# Patient Record
Sex: Female | Born: 1935
Health system: Southern US, Community
[De-identification: ages and names within clinical notes are randomized; demographics above are authoritative.]

## PROBLEM LIST (undated history)

## (undated) DIAGNOSIS — E559 Vitamin D deficiency, unspecified: Secondary | ICD-10-CM

## (undated) DIAGNOSIS — I10 Essential (primary) hypertension: Secondary | ICD-10-CM

## (undated) DIAGNOSIS — L630 Alopecia (capitis) totalis: Secondary | ICD-10-CM

## (undated) DIAGNOSIS — E039 Hypothyroidism, unspecified: Secondary | ICD-10-CM

## (undated) DIAGNOSIS — F32A Depression, unspecified: Secondary | ICD-10-CM

## (undated) DIAGNOSIS — R3129 Other microscopic hematuria: Secondary | ICD-10-CM

## (undated) DIAGNOSIS — K5792 Diverticulitis of intestine, part unspecified, without perforation or abscess without bleeding: Secondary | ICD-10-CM

## (undated) DIAGNOSIS — N183 Chronic kidney disease, stage 3 unspecified: Secondary | ICD-10-CM

## (undated) DIAGNOSIS — R51 Headache: Secondary | ICD-10-CM

## (undated) DIAGNOSIS — G8929 Other chronic pain: Secondary | ICD-10-CM

## (undated) DIAGNOSIS — M1990 Unspecified osteoarthritis, unspecified site: Secondary | ICD-10-CM

## (undated) DIAGNOSIS — F329 Major depressive disorder, single episode, unspecified: Secondary | ICD-10-CM

## (undated) DIAGNOSIS — D696 Thrombocytopenia, unspecified: Secondary | ICD-10-CM

## (undated) DIAGNOSIS — R0600 Dyspnea, unspecified: Secondary | ICD-10-CM

## (undated) DIAGNOSIS — M8580 Other specified disorders of bone density and structure, unspecified site: Secondary | ICD-10-CM

## (undated) DIAGNOSIS — E0822 Diabetes mellitus due to underlying condition with diabetic chronic kidney disease: Secondary | ICD-10-CM

## (undated) DIAGNOSIS — K5790 Diverticulosis of intestine, part unspecified, without perforation or abscess without bleeding: Secondary | ICD-10-CM

## (undated) DIAGNOSIS — E785 Hyperlipidemia, unspecified: Secondary | ICD-10-CM

## (undated) DIAGNOSIS — M549 Dorsalgia, unspecified: Secondary | ICD-10-CM

## (undated) HISTORY — DX: Thrombocytopenia, unspecified: D69.6

## (undated) HISTORY — PX: OTHER SURGICAL HISTORY: SHX169

## (undated) HISTORY — DX: Unspecified osteoarthritis, unspecified site: M19.90

## (undated) HISTORY — DX: Other specified disorders of bone density and structure, unspecified site: M85.80

## (undated) HISTORY — DX: Hyperlipidemia, unspecified: E78.5

## (undated) HISTORY — PX: CATARACT EXTRACTION, BILATERAL: SHX1313

## (undated) HISTORY — DX: Diabetes mellitus due to underlying condition with diabetic chronic kidney disease: E08.22

## (undated) HISTORY — DX: Other microscopic hematuria: R31.29

## (undated) HISTORY — DX: Alopecia (capitis) totalis: L63.0

## (undated) HISTORY — DX: Major depressive disorder, single episode, unspecified: F32.9

## (undated) HISTORY — DX: Vitamin D deficiency, unspecified: E55.9

## (undated) HISTORY — DX: Essential (primary) hypertension: I10

## (undated) HISTORY — DX: Depression, unspecified: F32.A

## (undated) HISTORY — DX: Other chronic pain: G89.29

## (undated) HISTORY — DX: Headache: R51

## (undated) HISTORY — DX: Chronic kidney disease, stage 3 (moderate): N18.3

## (undated) HISTORY — DX: Diverticulosis of intestine, part unspecified, without perforation or abscess without bleeding: K57.90

## (undated) HISTORY — DX: Chronic kidney disease, stage 3 unspecified: N18.30

---

## 2002-05-18 ENCOUNTER — Encounter: Payer: Self-pay | Admitting: Emergency Medicine

## 2002-05-18 ENCOUNTER — Emergency Department (HOSPITAL_COMMUNITY): Admission: EM | Admit: 2002-05-18 | Discharge: 2002-05-18 | Payer: Self-pay | Admitting: Emergency Medicine

## 2003-12-18 ENCOUNTER — Emergency Department (HOSPITAL_COMMUNITY): Admission: EM | Admit: 2003-12-18 | Discharge: 2003-12-19 | Payer: Self-pay | Admitting: Emergency Medicine

## 2005-12-31 ENCOUNTER — Inpatient Hospital Stay (HOSPITAL_COMMUNITY): Admission: AD | Admit: 2005-12-31 | Discharge: 2006-01-06 | Payer: Self-pay | Admitting: Endocrinology

## 2006-01-17 ENCOUNTER — Inpatient Hospital Stay (HOSPITAL_COMMUNITY): Admission: AD | Admit: 2006-01-17 | Discharge: 2006-01-21 | Payer: Self-pay | Admitting: Endocrinology

## 2007-11-30 ENCOUNTER — Ambulatory Visit: Payer: Self-pay | Admitting: Hematology & Oncology

## 2007-12-13 LAB — CBC WITH DIFFERENTIAL (CANCER CENTER ONLY)
BASO#: 0 10*3/uL (ref 0.0–0.2)
BASO%: 0.7 % (ref 0.0–2.0)
EOS%: 1.7 % (ref 0.0–7.0)
HCT: 40.4 % (ref 34.8–46.6)
HGB: 13.6 g/dL (ref 11.6–15.9)
LYMPH%: 38.3 % (ref 14.0–48.0)
MCH: 29.6 pg (ref 26.0–34.0)
MCHC: 33.7 g/dL (ref 32.0–36.0)
MCV: 88 fL (ref 81–101)
MONO%: 5.2 % (ref 0.0–13.0)
NEUT%: 54.1 % (ref 39.6–80.0)
RDW: 11.7 % (ref 10.5–14.6)

## 2007-12-13 LAB — CHCC SATELLITE - SMEAR

## 2007-12-18 LAB — VITAMIN B12: Vitamin B-12: 266 pg/mL (ref 211–911)

## 2007-12-18 LAB — COMPREHENSIVE METABOLIC PANEL
ALT: 22 U/L (ref 0–35)
AST: 15 U/L (ref 0–37)
Albumin: 4.1 g/dL (ref 3.5–5.2)
Alkaline Phosphatase: 76 U/L (ref 39–117)
BUN: 20 mg/dL (ref 6–23)
CO2: 25 mEq/L (ref 19–32)
Calcium: 9.7 mg/dL (ref 8.4–10.5)
Chloride: 102 mEq/L (ref 96–112)
Creatinine, Ser: 1.13 mg/dL (ref 0.40–1.20)
Glucose, Bld: 114 mg/dL — ABNORMAL HIGH (ref 70–99)
Potassium: 4.3 mEq/L (ref 3.5–5.3)
Sodium: 137 mEq/L (ref 135–145)
Total Bilirubin: 0.4 mg/dL (ref 0.3–1.2)
Total Protein: 7.7 g/dL (ref 6.0–8.3)

## 2007-12-18 LAB — LUPUS ANTICOAGULANT PANEL
DRVVT: 43.1 secs (ref 36.1–47.0)
PTT Lupus Anticoagulant: 49.6 secs — ABNORMAL HIGH (ref 36.3–48.8)

## 2007-12-18 LAB — PROTEIN ELECTROPHORESIS, SERUM
Alpha-1-Globulin: 4.6 % (ref 2.9–4.9)
Alpha-2-Globulin: 11.8 % (ref 7.1–11.8)
Beta Globulin: 6.8 % (ref 4.7–7.2)
Gamma Globulin: 19.7 % — ABNORMAL HIGH (ref 11.1–18.8)
Total Protein, Serum Electrophoresis: 7.7 g/dL (ref 6.0–8.3)

## 2007-12-18 LAB — APTT: aPTT: 34 seconds (ref 24–37)

## 2007-12-18 LAB — ANA: Anti Nuclear Antibody(ANA): NEGATIVE

## 2008-09-27 ENCOUNTER — Emergency Department (HOSPITAL_COMMUNITY): Admission: EM | Admit: 2008-09-27 | Discharge: 2008-09-27 | Payer: Self-pay | Admitting: Family Medicine

## 2009-08-12 ENCOUNTER — Encounter: Admission: RE | Admit: 2009-08-12 | Discharge: 2009-08-12 | Payer: Self-pay | Admitting: Internal Medicine

## 2009-11-28 ENCOUNTER — Emergency Department (HOSPITAL_COMMUNITY): Admission: EM | Admit: 2009-11-28 | Discharge: 2009-11-28 | Payer: Self-pay | Admitting: Emergency Medicine

## 2010-03-31 LAB — DIFFERENTIAL
Basophils Absolute: 0.1 10*3/uL (ref 0.0–0.1)
Basophils Relative: 1 % (ref 0–1)
Eosinophils Absolute: 0.2 10*3/uL (ref 0.0–0.7)
Eosinophils Relative: 2 % (ref 0–5)
Lymphocytes Relative: 15 % (ref 12–46)
Lymphs Abs: 1.7 10*3/uL (ref 0.7–4.0)
Monocytes Absolute: 0.7 10*3/uL (ref 0.1–1.0)
Monocytes Relative: 6 % (ref 3–12)
Neutro Abs: 8.3 10*3/uL — ABNORMAL HIGH (ref 1.7–7.7)
Neutrophils Relative %: 76 % (ref 43–77)

## 2010-03-31 LAB — COMPREHENSIVE METABOLIC PANEL
ALT: 33 U/L (ref 0–35)
AST: 36 U/L (ref 0–37)
Albumin: 4.3 g/dL (ref 3.5–5.2)
Alkaline Phosphatase: 101 U/L (ref 39–117)
BUN: 12 mg/dL (ref 6–23)
CO2: 22 mEq/L (ref 19–32)
Calcium: 9.7 mg/dL (ref 8.4–10.5)
Chloride: 103 mEq/L (ref 96–112)
Creatinine, Ser: 1.24 mg/dL — ABNORMAL HIGH (ref 0.4–1.2)
GFR calc Af Amer: 50 mL/min — ABNORMAL LOW (ref >60)
GFR calc non Af Amer: 41 mL/min — ABNORMAL LOW (ref >60)
Glucose, Bld: 131 mg/dL — ABNORMAL HIGH (ref 70–99)
Potassium: 4.1 mEq/L (ref 3.5–5.1)
Sodium: 137 mEq/L (ref 135–145)
Total Bilirubin: 0.8 mg/dL (ref 0.3–1.2)
Total Protein: 8.1 g/dL (ref 6.0–8.3)

## 2010-03-31 LAB — CBC
HCT: 43.1 % (ref 36.0–46.0)
Hemoglobin: 14.9 g/dL (ref 12.0–15.0)
MCH: 29.9 pg (ref 26.0–34.0)
MCHC: 34.6 g/dL (ref 30.0–36.0)
MCV: 86.5 fL (ref 78.0–100.0)
Platelets: ADEQUATE 10*3/uL (ref 150–400)
RBC: 4.99 MIL/uL (ref 3.87–5.11)
RDW: 13.8 % (ref 11.5–15.5)
WBC: 11 10*3/uL — ABNORMAL HIGH (ref 4.0–10.5)

## 2010-03-31 LAB — URINALYSIS, ROUTINE W REFLEX MICROSCOPIC
Bilirubin Urine: NEGATIVE
Glucose, UA: NEGATIVE mg/dL
Ketones, ur: NEGATIVE mg/dL
Leukocytes, UA: NEGATIVE
Nitrite: NEGATIVE
Protein, ur: NEGATIVE mg/dL
Specific Gravity, Urine: 1.011 (ref 1.005–1.030)
Urobilinogen, UA: 0.2 mg/dL (ref 0.0–1.0)
pH: 6.5 (ref 5.0–8.0)

## 2010-03-31 LAB — URINE MICROSCOPIC-ADD ON

## 2010-04-24 LAB — CBC
HCT: 41.8 % (ref 36.0–46.0)
Hemoglobin: 13.9 g/dL (ref 12.0–15.0)
MCHC: 33.1 g/dL (ref 30.0–36.0)
MCV: 88.6 fL (ref 78.0–100.0)
Platelets: UNDETERMINED 10*3/uL (ref 150–400)
RBC: 4.72 MIL/uL (ref 3.87–5.11)
RDW: 13.7 % (ref 11.5–15.5)
WBC: 7 10*3/uL (ref 4.0–10.5)

## 2010-04-24 LAB — HEPATIC FUNCTION PANEL
ALT: 28 U/L (ref 0–35)
AST: 26 U/L (ref 0–37)
Albumin: 4.1 g/dL (ref 3.5–5.2)
Alkaline Phosphatase: 95 U/L (ref 39–117)
Bilirubin, Direct: <0.1 mg/dL (ref 0.0–0.3)
Total Bilirubin: 0.3 mg/dL (ref 0.3–1.2)
Total Protein: 7.9 g/dL (ref 6.0–8.3)

## 2010-04-24 LAB — URINE CULTURE
Colony Count: NO GROWTH
Culture: NO GROWTH

## 2010-04-24 LAB — POCT URINALYSIS DIP (DEVICE)
Glucose, UA: 100 mg/dL — AB
Ketones, ur: NEGATIVE mg/dL
Nitrite: POSITIVE — AB
Protein, ur: 30 mg/dL — AB
Specific Gravity, Urine: 1.02 (ref 1.005–1.030)
Urobilinogen, UA: 1 mg/dL (ref 0.0–1.0)
pH: 5.5 (ref 5.0–8.0)

## 2010-04-24 LAB — DIFFERENTIAL
Basophils Absolute: 0 10*3/uL (ref 0.0–0.1)
Basophils Relative: 0 % (ref 0–1)
Eosinophils Absolute: 0.1 10*3/uL (ref 0.0–0.7)
Eosinophils Relative: 2 % (ref 0–5)
Lymphocytes Relative: 21 % (ref 12–46)
Lymphs Abs: 1.5 10*3/uL (ref 0.7–4.0)
Monocytes Absolute: 0.4 10*3/uL (ref 0.1–1.0)
Monocytes Relative: 6 % (ref 3–12)
Neutro Abs: 5 10*3/uL (ref 1.7–7.7)
Neutrophils Relative %: 71 % (ref 43–77)

## 2010-06-05 NOTE — Discharge Summary (Signed)
Kelly Barker, Kelly Barker               ACCOUNT NO.:  [id number]   MEDICAL RECORD NO.:  [id number]          PATIENT TYPE:  INP   LOCATION:  5742                         FACILITY:  [facility]   PHYSICIAN:  Alfonse Alpers. Gegick, M.D.DATE OF BIRTH:  [birth year]   DATE OF ADMISSION:  01/17/2006  DATE OF DISCHARGE:                               DISCHARGE SUMMARY   HISTORY OF PRESENT ILLNESS:  This is a 77 year old woman who presents  with a history of abdominal pain. The patient has previously had an  episode of diverticulitis. She was discharged from the hospital  approximately one week prior to this admission. She was doing better,  feeling better, not having any abdominal pain for several days after her  discharge and then she started having increased nausea, diarrhea, and  abdominal pain.   She also has a history of diabetes and dyslipidemia. In addition she has  had an episode of thrombocytopenia for unclear reasons, and this has  resolved spontaneously.   PHYSICAL EXAMINATION:  GENERAL:  Revealed a well-developed woman who  appeared in no distress. She was markedly uncomfortable.  ABDOMEN:  Soft. Tenderness was present in the left middle quadrant of  the abdomen.   IMPRESSION ON ADMISSION:  Recurrent diverticulitis.   The patient was admitted to the hospital. A CT scan was ordered. She was  started on liquids intravenously. A CT scan of the abdomen was  consistent with a diffuse inflammatory process.  This was consistent  with C. difficile. A C. difficile study was done, and, indeed, it was  positive.  Her white count on admission was 14,000. Her blood pressure  was controlled. She was then switched from IV metronidazole to p.o. and  gradually has improved. She has remained afebrile. She is improved and  now tolerating full liquid diet without difficulty. Her diarrhea has  improved but still present. In view of the fact that she appears to be  stable at this time she will be discharged and  followed closely as an  outpatient.   IMPRESSION ON DISCHARGE:  1. Clostridium difficile pneumonia.  2. History of diabetes mellitus.  3. History of dyslipidemia.   MEDICATIONS ON DISCHARGE:  She will continue taking the medications that  she had been taking prior to her admission. She will be taking Actos 30  mg daily, Tenormin 25 mg daily, Synthroid 0.1 mg daily, Desyrel 100 mg  h.s., Pravachol 20 mg h.s.   DIET ON DISCHARGE:  Will be on a full liquid diet.   ACTIVITY ON DISCHARGE:  As tolerated.   NEW MEDICATIONS:  New medications added will be Flagyl 500 mg one t.i.d.  and also Lomotil q.4 h. p.r.n.   PLANNED FOLLOWUP:  She will be seen in the office in a period of four  days.   CONDITION ON DISCHARGE:  Improved.           ______________________________  Alfonse Alpers Dagoberto Ligas, M.D.     CGG/MEDQ  D:  01/21/2006  T:  01/21/2006  Job:  [id number]

## 2010-06-05 NOTE — Consult Note (Signed)
Kelly Barker, Kelly Barker               ACCOUNT NO.:  [id number]   MEDICAL RECORD NO.:  [id number]          PATIENT TYPE:  INP   LOCATION:  5741                         FACILITY:  [facility]   PHYSICIAN:  Revonda Standard L. Rennis Harding, N.P. DATE OF BIRTH:  [birth year]   DATE OF CONSULTATION:  DATE OF DISCHARGE:                                 CONSULTATION   PRIMARY CARE PHYSICIAN:  Dr. Dagoberto Ligas.   REASON FOR CONSULTATION:  Known diverticular disease.  No upper quadrant  abdominal pain.   HISTORY OF PRESENT ILLNESS:  Kelly Barker is a 77 year old female patient  with known history of diverticular disease dating back to a CT scan done  in 2005 which demonstrated left colonic diverticula.  She also underwent  colonoscopy in September 2007 that revealed diverticular disease as  well.  The patient states about 3 weeks ago she was having some sort of  abdominal pain although not like today's pain.  She was given unknown  medication.  The patient cannot remember the name of the medicine.  This  medicine did make her dizzy, so she was treated with Darvocet after  that, and her pain resolved.  While she was having this pain, she had no  fever, blood in her stools or diarrhea.  Last night the patient  developed left lower quadrant pain which progressively became more  severe during the night.  Because the pain was so severe by this  morning, she contacted Dr. Dagoberto Ligas for evaluation.  The patient states  that the pain is so severe at this time that she is unable to walk  completely upright because of the pain.  She has not had any fevers or  chills, and her last bowel movement was this morning and was normal in  appearance, and there was no pain with passage of a BM.  Dr. Jerelene Redden  evaluation revealed a patient with severe left lower quadrant pain and  guarding.  Labs were checked at the office, and her white count was 9300  as she was afebrile.  The Dr. Dagoberto Ligas was concern about early  diverticular disease and possible  abscess so he admitted the patient for  IV antibiotic therapy and has requested surgical consultation.   REVIEW OF SYSTEMS:  As above.  The patient has not had any process  postprandial, pain or bloating.  No diarrhea.  No dark or bloody stools.  No fevers, chills or myalgias.   PAST MEDICAL HISTORY:  1. Hypertension.  2. Diabetes mellitus.  3. Hypothyroidism.  4. Dyslipidemia.  5. Degenerative joint disease.  6. Depression.  7. GERD.  8. History of thrombocytopenia, platelets had normalized by November      2007, according to office notes.  9. Positional vertigo.   PAST SURGICAL HISTORY:  1. Cystoscopy secondary to interstitial cystitis.  2. D&C   SOCIAL HISTORY:  She is divorced.  She lives alone.  No tobacco.  Occasional social alcohol.  Daughter accompanies the patient today at  hospital visit.   FAMILY HISTORY:  Noncontributory.   ALLERGIES:  NKDA.   CURRENT MEDICATIONS:  See medication reconciliation sheet.  Since  admission, the patient has been placed on:  1. IV Cipro and Flagyl.  2. IV pain medications.  3. N.p.o. status with Glucometer checks and sliding scale insulin.   PHYSICAL EXAMINATION:  GENERAL:  Pleasant female patient complaining of  severe left lower quadrant abdominal pain.  VITAL SIGNS:  The patient is currently afebrile.  Vital signs were  stable.  NEUROLOGICAL:  The patient is alert and oriented x3, moving all  extremities x4.  HEENT:  Head normocephalic, sclera noninjected.  NECK:  Supple.  No adenopathy.  CHEST:  Bilateral lung sounds are clear to auscultation.  Respiratory  effort is nonlabored.  She is on room air. CARDIAC:  Heart sounds are S1  and S2.  No rubs, murmurs, thrills, no gallops.  Pulses slightly  irregular.  ABDOMEN:  Soft.  Bowel sounds are present and hyperactive  abdomen is nondistended.  She is tender in the left lower quadrant with  guarding and rebounding.  EXTREMITIES:  Symmetrical in appearance with trace lower  extremity  edema.  Peripheral pulses are palpable.   LABORATORY DATA:  The patient did have a complete blood count drawn at  the office.  Hemoglobin 13.7, white count 9300, platelets are 98,000.  The urinalysis was obtained that demonstrated white blood cells 12-15,  RBCs 4-5.  Amylase and CMP are pending at time of admission.  Diagnostic  CT of the abdomen and pelvis has been ordered.   IMPRESSION:  1. Known diverticular disease.  The patient now with severe left lower      quadrant pain with concerns regarding abscess or colonic      perforation.  2. Diabetes mellitus.  3. Hypertension.  4. Dyslipidemia.  5. Thrombocytopenia, now recurrent.   PLAN:  1. Agree with n.p.o. status, IV fluids, IV Cipro, Flagyl, I.V. pain      medications.  2. Follow-up CT scan.  Concern at this point over the possibility of      perforation, i.e., free air and/or abscess formation.  3. I discussed with the patient and her daughter, surgical options      depending on the CT scan results.  The patient is aware that if she      has free air seen on CT that this would potentially require      emergent surgical repair and possibly involving colostomy pending      on findings.  4. Additional recommendations per Dr. Jamey Ripa.     Allison L. Rennis Harding, N.P.    ALE/MEDQ  D:  12/31/2005  T:  12/31/2005  Job:  [id number]   cc:   Alfonse Alpers. Dagoberto Ligas, M.D.

## 2010-06-05 NOTE — H&P (Signed)
Kelly Barker, Kelly Barker               ACCOUNT NO.:  [id number]   MEDICAL RECORD NO.:  [id number]          PATIENT TYPE:  INP   LOCATION:  5742                         FACILITY:  [facility]   PHYSICIAN:  Alfonse Alpers. Gegick, M.D.DATE OF BIRTH:  August 04, 1925   DATE OF ADMISSION:  01/17/2006  DATE OF DISCHARGE:                              HISTORY & PHYSICAL   CHIEF COMPLAINT:  This is a 77 year old woman who presents with a  history of abdominal pain.   HISTORY OF PRESENT ILLNESS:  The patient has a history of  diverticulitis.  She was recently admitted to the hospital with an  episode of diverticulitis.  She has an inflammation consistent with  diverticulitis as seen on the CT scan.  She has a history of  diverticulosis as documented by a previous routine colonoscopy.  She was  admitted to the hospital previously, was treated with IV Cipro and also  metronidazole.  She improved significantly.  She had minimal tenderness  in the abdomen.  She was discharged receiving Cipro and also on a low-  residue diet.  She was doing well until Saturday prior to this admission  when she awoke with a feeling of abdominal discomfort, and this was  associated with diarrhea.  She has had frequent bowel movements which  have been small, and she has significant abdominal pain.   She has a history of diabetes mellitus which is easily controlled.  Her  last Alc was 6%, and she has been taking Actos for this.   She has a history of dyslipidemia for which she is taking Statins  intermittently.  She also has a history of hypothyroidism that is  compensated.  She has a history of hypertension, and when she was in the  hospital, she required increased medications.   Of note is the fact that she had an episode of thrombocytopenia in the  past.  This has resolved spontaneously, and today's platelet count is  normal at 255.   PAST MEDICAL HISTORY:  As noted in the chart during her last admission  of 2 weeks ago.   FAMILY/PERSONAL HISTORY:  None of which has changed.   PHYSICAL EXAMINATION:  GENERAL:  Reveals a well-developed woman who  appears markedly uncomfortable.  HEAD:  Normocephalic.  LUNGS:  Clear.  CARDIOVASCULAR:  Rhythm is regular.  No murmurs are present.  ABDOMEN:  Soft.  It is distended.  Tenderness is present in the left mid  abdomen and also in the left lower quadrant of the abdomen but more  located in the left mid abdomen.  Rebound tenderness is present but is  mild.  EXTREMITIES:  Showed no pedal edema.  NEUROMUSCULAR:  Normal.   IMPRESSION:  1. Diverticulitis.  2. Diabetes mellitus type 2.  3. A history of dyslipidemia.  4. Hypothyroidism (compensated).  5. A history of alopecia totalis.  6. A history of hypertension.  7. Thrombocytopenia (not present now).           ______________________________  Alfonse Alpers Dagoberto Ligas, M.D.     CGG/MEDQ  D:  01/17/2006  T:  01/18/2006  Job:  [id number] 

## 2010-06-05 NOTE — H&P (Signed)
Kelly Barker, Kelly Barker               ACCOUNT NO.:  [id number]   MEDICAL RECORD NO.:  [id number]          PATIENT TYPE:  INP   LOCATION:  5741                         FACILITY:  [facility]   PHYSICIAN:  Alfonse Alpers. Gegick, M.D.DATE OF BIRTH:  [birth year]   DATE OF ADMISSION:  12/31/2005  DATE OF DISCHARGE:                              HISTORY & PHYSICAL   CHIEF COMPLAINT:  This is a 77 year old woman who presents with a  history of abdominal pain.   HISTORY OF PRESENT ILLNESS:  The patient has been in her usual health  until approximately two to three days ago when she began having  nonspecific symptoms as abdominal discomfort.  After two days, which is  the morning of this admission, she awoke and had severe onset of pain in  her left lower quadrant of the abdomen.  She had pain walking.  Straightening up was more difficult and was painful.  She was not  vomiting.  There was no change in her bowel habits.  Of note is the fact  that in September 2007 she had a routine colonoscopy and at that time it  showed diffuse diverticulosis in both the right and the left colon.   She has a history of diabetes mellitus.  This has been well controlled  with a most recent A1c of 6.0%.  She is currently receiving Actos at 15  mg daily for this.   She has a history of dyslipidemia, and this has been treated with  Pravachol with good results.  In addition, she has hypothyroidism which  is compensated with thyroid hormone.  She has alopecia totalis.  She  also has a history of esophageal reflux.  She has been taking AcipHex  for this with good results, and of note is the fact that she had some  thrombocytopenia in the past in 2002, but this has resolved  spontaneously.  She has had both her flu vaccine and her pneumonia  vaccine is current.   PAST MEDICAL HISTORY:  Essentially that as noted above.   FAMILY HISTORY:  Her mother died of carcinoma of the uterus.  Her father  died of carcinoma of the  prostate.  She has one sister.   She is divorced and has two children.   Medications prior to this admission include:  1. Desyrel 100 mg 1 nightly.  2. Valium 5 mg 1 b.i.d. p.r.n.  3. Synthroid 0.1 mg daily.  4. Pravachol 20 mg 1 nightly.  5. Calcium two daily.  6. Maxzide 25 one-half daily.  7. Tenormin 25 one daily.  8. Actos 15 mg 1 daily.  9. Bentyl 10 mg t.i.d. p.r.n.  10.AcipHex 20 mg 1 daily.   PERSONAL HISTORY:  She does not smoke.  She does not drink excessive  amounts of alcohol.   SHE HAS A DIFFICULTY TOLERATING TOLECTIN.   REVIEW OF SYSTEMS:  Her weight has been stable.  CARDIORESPIRATORY:  No  complaints.  GASTROINTESTINAL:  See above.  GENITOURINARY:  No  complaints.   PHYSICAL EXAMINATION:  GENERAL:  This is a well-developed woman who  appears to be uncomfortable.  SKIN:  Her skin is normal except for the fact that she has alopecia  totalis.  HEENT:  Her head is normocephalic.  NECK:  Supple.  LUNGS:  Clear.  CARDIOVASCULAR:  Rhythm is regular.  BREASTS:  Checked two months ago.  ABDOMEN:  Soft.  It is distended mildly.  Point tenderness is present in  the left lower quadrant of the abdomen.  Some rebound tenderness is  localized to that area.  Tympani is present in the epigastric area.  Bowel sounds are hypoactive.  EXTREMITIES:  No pedal edema.  NEUROMUSCULAR:  Essentially normal.   IMPRESSION:  1. Diverticulitis.  2. Diabetes mellitus type 2.  3. History of depression.  4. History of urinary tract infections.  5. Dyslipidemia.  6. Hypothyroidism (compensating).  7. Alopecia totalis.           ______________________________  Alfonse Alpers Dagoberto Ligas, M.D.     CGG/MEDQ  D:  12/31/2005  T:  01/01/2006  Job:  [id number]

## 2010-09-03 ENCOUNTER — Other Ambulatory Visit: Payer: Self-pay | Admitting: Geriatric Medicine

## 2010-09-08 ENCOUNTER — Ambulatory Visit
Admission: RE | Admit: 2010-09-08 | Discharge: 2010-09-08 | Disposition: A | Discharge status: Home / Self Care | Payer: Medicare Other | Source: Ambulatory Visit | Attending: Geriatric Medicine | Admitting: Geriatric Medicine

## 2010-09-08 MED ORDER — IOHEXOL 300 MG/ML  SOLN
100.0000 mL | Freq: Once | INTRAMUSCULAR | Status: AC | PRN
  Administered 2010-09-08: 100 mL via INTRAVENOUS

## 2010-11-27 ENCOUNTER — Other Ambulatory Visit: Payer: Self-pay | Admitting: Geriatric Medicine

## 2010-11-27 DIAGNOSIS — E010 Iodine-deficiency related diffuse (endemic) goiter: Secondary | ICD-10-CM

## 2010-12-01 ENCOUNTER — Ambulatory Visit
Admission: RE | Admit: 2010-12-01 | Discharge: 2010-12-01 | Disposition: A | Discharge status: Home / Self Care | Payer: Medicare Other | Source: Ambulatory Visit | Attending: Geriatric Medicine | Admitting: Geriatric Medicine

## 2010-12-01 DIAGNOSIS — E010 Iodine-deficiency related diffuse (endemic) goiter: Secondary | ICD-10-CM

## 2012-08-23 ENCOUNTER — Other Ambulatory Visit: Payer: Self-pay | Admitting: Nurse Practitioner

## 2012-08-23 ENCOUNTER — Ambulatory Visit
Admission: RE | Admit: 2012-08-23 | Discharge: 2012-08-23 | Disposition: A | Discharge status: Home / Self Care | Payer: Medicare Other | Source: Ambulatory Visit | Attending: Nurse Practitioner | Admitting: Nurse Practitioner

## 2012-08-23 DIAGNOSIS — R109 Unspecified abdominal pain: Secondary | ICD-10-CM

## 2012-08-23 MED ORDER — IOHEXOL 300 MG/ML  SOLN
100.0000 mL | Freq: Once | INTRAMUSCULAR | Status: AC | PRN
  Administered 2012-08-23: 100 mL via INTRAVENOUS

## 2012-08-23 MED ORDER — IOHEXOL 300 MG/ML  SOLN
40.0000 mL | Freq: Once | INTRAMUSCULAR | Status: AC | PRN
  Administered 2012-08-23: 40 mL via ORAL

## 2014-03-27 ENCOUNTER — Emergency Department (INDEPENDENT_AMBULATORY_CARE_PROVIDER_SITE_OTHER)
Admission: EM | Admit: 2014-03-27 | Discharge: 2014-03-27 | Disposition: A | Discharge status: Home / Self Care | Payer: Self-pay | Source: Home / Self Care | Attending: Family Medicine | Admitting: Family Medicine

## 2014-03-27 ENCOUNTER — Encounter (HOSPITAL_COMMUNITY): Payer: Self-pay

## 2014-03-27 DIAGNOSIS — M546 Pain in thoracic spine: Secondary | ICD-10-CM
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
## 2014-03-27 NOTE — ED Notes (Signed)
patient states earlier today , she [place] and [place] from [place]

## 2014-03-27 NOTE — Discharge Instructions (Signed)
Thank you for coming in today. Take Tylenol 2 extra strength Tylenol every 6 hours for pain on a scheduled for the next few days. Take ibuprofen as needed.  Use a heating pad on your sore back.  Return as needed.  Come back or go to the emergency room if you notice new weakness new numbness problems walking or bowel or bladder problems. Follow up with your doctor.    Back Pain, Adult Low back pain is very common. About 1 in 5 people have back pain.The cause of low back pain is rarely dangerous. The pain often gets better over time.About half of people with a sudden onset of back pain feel better in just 2 weeks. About 8 in 10 people feel better by 6 weeks.  CAUSES Some common causes of back pain include:  Strain of the muscles or ligaments supporting the spine.  Wear and tear (degeneration) of the spinal discs.  Arthritis.  Direct injury to the back. DIAGNOSIS Most of the time, the direct cause of low back pain is not known.However, back pain can be treated effectively even when the exact cause of the pain is unknown.Answering your caregiver's questions about your overall health and symptoms is one of the most accurate ways to make sure the cause of your pain is not dangerous. If your caregiver needs more information, he or she may order lab work or imaging tests (X-rays or MRIs).However, even if imaging tests show changes in your back, this usually does not require surgery. HOME CARE INSTRUCTIONS For many people, back pain returns.Since low back pain is rarely dangerous, it is often a condition that people can learn to [facility] their own.   Remain active. It is stressful on the back to sit or stand in one place. Do not sit, drive, or stand in one place for more than 30 minutes at a time. Take short walks on level surfaces as soon as pain allows.Try to increase the length of time you walk each day.  Do not stay in bed.Resting more than 1 or 2 days can delay your recovery.  Do  not avoid exercise or work.Your body is made to move.It is not dangerous to be active, even though your back may hurt.Your back will likely heal faster if you return to being active before your pain is gone.  Pay attention to your body when you bend and lift. Many people have less discomfortwhen lifting if they bend their knees, keep the load close to their bodies,and avoid twisting. Often, the most comfortable positions are those that put less stress on your recovering back.  Find a comfortable position to sleep. Use a firm mattress and lie on your side with your knees slightly bent. If you lie on your back, put a pillow under your knees.  Only take over-the-counter or prescription medicines as directed by your caregiver. Over-the-counter medicines to reduce pain and inflammation are often the most helpful.Your caregiver may prescribe muscle relaxant drugs.These medicines help dull your pain so you can more quickly return to your normal activities and healthy exercise.  Put ice on the injured area.  Put ice in a plastic bag.  Place a towel between your skin and the bag.  Leave the ice on for 15-20 minutes, 03-04 times a day for the first 2 to 3 days. After that, ice and heat may be alternated to reduce pain and spasms.  Ask your caregiver about trying back exercises and gentle massage. This may be of some benefit.  Avoid feeling anxious or stressed.Stress increases muscle tension and can worsen back pain.It is important to recognize when you are anxious or stressed and learn ways to manage it.Exercise is a great option. SEEK MEDICAL CARE IF:  You have pain that is not relieved with rest or medicine.  You have pain that does not improve in 1 week.  You have new symptoms.  You are generally not feeling well. SEEK IMMEDIATE MEDICAL CARE IF:   You have pain that radiates from your back into your legs.  You develop new bowel or bladder control problems.  You have unusual  weakness or numbness in your arms or legs.  You develop nausea or vomiting.  You develop abdominal pain.  You feel faint. Document Released: 01/04/2005 Document Revised: 07/06/2011 Document Reviewed: 05/08/2013 [facility] Patient Information 2015 [place], [place]. This information is not intended to replace advice given to you by your health care provider. Make sure you discuss any questions you have with your health care provider.

## 2014-03-29 ENCOUNTER — Ambulatory Visit (INDEPENDENT_AMBULATORY_CARE_PROVIDER_SITE_OTHER): Payer: Medicare Other | Admitting: Podiatry

## 2014-03-29 ENCOUNTER — Encounter: Payer: Self-pay | Admitting: Podiatry

## 2014-03-29 VITALS — BP 111/64 | HR 59 | Resp 14

## 2014-03-29 DIAGNOSIS — M79676 Pain in unspecified toe(s): Secondary | ICD-10-CM

## 2014-03-29 DIAGNOSIS — B351 Tinea unguium: Secondary | ICD-10-CM
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
## 2014-07-08 ENCOUNTER — Ambulatory Visit: Payer: Medicare Other

## 2016-01-13 ENCOUNTER — Other Ambulatory Visit: Payer: Self-pay | Admitting: Internal Medicine

## 2016-01-13 DIAGNOSIS — R1013 Epigastric pain: Secondary | ICD-10-CM

## 2016-01-15 ENCOUNTER — Ambulatory Visit
Admission: RE | Admit: 2016-01-15 | Discharge: 2016-01-15 | Disposition: A | Discharge status: Home / Self Care | Payer: Medicare Other | Source: Ambulatory Visit | Attending: Internal Medicine | Admitting: Internal Medicine

## 2016-01-15 DIAGNOSIS — R1013 Epigastric pain: Secondary | ICD-10-CM

## 2016-08-18 DIAGNOSIS — K5792 Diverticulitis of intestine, part unspecified, without perforation or abscess without bleeding: Secondary | ICD-10-CM

## 2016-08-18 HISTORY — DX: Diverticulitis of intestine, part unspecified, without perforation or abscess without bleeding: K57.92

## 2016-09-05 ENCOUNTER — Encounter (HOSPITAL_COMMUNITY): Payer: Self-pay | Admitting: Emergency Medicine

## 2016-09-05 ENCOUNTER — Inpatient Hospital Stay (HOSPITAL_COMMUNITY)
Admission: EM | Admit: 2016-09-05 | Discharge: 2016-09-08 | DRG: 392 | Disposition: A | Discharge status: Home / Self Care | Payer: Medicare Other | Attending: Internal Medicine | Admitting: Internal Medicine

## 2016-09-05 ENCOUNTER — Emergency Department (HOSPITAL_COMMUNITY): Payer: Medicare Other

## 2016-09-05 DIAGNOSIS — K5720 Diverticulitis of large intestine with perforation and abscess without bleeding: Principal | ICD-10-CM | POA: Diagnosis present

## 2016-09-05 DIAGNOSIS — Z9842 Cataract extraction status, left eye: Secondary | ICD-10-CM

## 2016-09-05 DIAGNOSIS — Z66 Do not resuscitate: Secondary | ICD-10-CM | POA: Diagnosis present

## 2016-09-05 DIAGNOSIS — E1122 Type 2 diabetes mellitus with diabetic chronic kidney disease: Secondary | ICD-10-CM | POA: Diagnosis present

## 2016-09-05 DIAGNOSIS — I10 Essential (primary) hypertension: Secondary | ICD-10-CM | POA: Diagnosis present

## 2016-09-05 DIAGNOSIS — M1990 Unspecified osteoarthritis, unspecified site: Secondary | ICD-10-CM | POA: Diagnosis present

## 2016-09-05 DIAGNOSIS — F329 Major depressive disorder, single episode, unspecified: Secondary | ICD-10-CM | POA: Diagnosis present

## 2016-09-05 DIAGNOSIS — F419 Anxiety disorder, unspecified: Secondary | ICD-10-CM | POA: Diagnosis present

## 2016-09-05 DIAGNOSIS — N183 Chronic kidney disease, stage 3 unspecified: Secondary | ICD-10-CM | POA: Diagnosis present

## 2016-09-05 DIAGNOSIS — M8580 Other specified disorders of bone density and structure, unspecified site: Secondary | ICD-10-CM | POA: Diagnosis present

## 2016-09-05 DIAGNOSIS — E1169 Type 2 diabetes mellitus with other specified complication: Secondary | ICD-10-CM | POA: Diagnosis present

## 2016-09-05 DIAGNOSIS — E119 Type 2 diabetes mellitus without complications: Secondary | ICD-10-CM | POA: Diagnosis present

## 2016-09-05 DIAGNOSIS — E039 Hypothyroidism, unspecified: Secondary | ICD-10-CM | POA: Diagnosis present

## 2016-09-05 DIAGNOSIS — Z7984 Long term (current) use of oral hypoglycemic drugs: Secondary | ICD-10-CM

## 2016-09-05 DIAGNOSIS — L659 Nonscarring hair loss, unspecified: Secondary | ICD-10-CM | POA: Diagnosis present

## 2016-09-05 DIAGNOSIS — M4807 Spinal stenosis, lumbosacral region: Secondary | ICD-10-CM | POA: Diagnosis present

## 2016-09-05 DIAGNOSIS — E669 Obesity, unspecified: Secondary | ICD-10-CM | POA: Diagnosis present

## 2016-09-05 DIAGNOSIS — D638 Anemia in other chronic diseases classified elsewhere: Secondary | ICD-10-CM | POA: Diagnosis present

## 2016-09-05 DIAGNOSIS — Z6831 Body mass index (BMI) 31.0-31.9, adult: Secondary | ICD-10-CM

## 2016-09-05 DIAGNOSIS — Z888 Allergy status to other drugs, medicaments and biological substances status: Secondary | ICD-10-CM

## 2016-09-05 DIAGNOSIS — I700 Atherosclerosis of aorta: Secondary | ICD-10-CM | POA: Diagnosis present

## 2016-09-05 DIAGNOSIS — K5792 Diverticulitis of intestine, part unspecified, without perforation or abscess without bleeding: Secondary | ICD-10-CM | POA: Diagnosis not present

## 2016-09-05 DIAGNOSIS — I129 Hypertensive chronic kidney disease with stage 1 through stage 4 chronic kidney disease, or unspecified chronic kidney disease: Secondary | ICD-10-CM | POA: Diagnosis present

## 2016-09-05 DIAGNOSIS — E559 Vitamin D deficiency, unspecified: Secondary | ICD-10-CM | POA: Diagnosis present

## 2016-09-05 DIAGNOSIS — E785 Hyperlipidemia, unspecified: Secondary | ICD-10-CM | POA: Diagnosis present

## 2016-09-05 DIAGNOSIS — Z79899 Other long term (current) drug therapy: Secondary | ICD-10-CM

## 2016-09-05 HISTORY — DX: Diverticulitis of intestine, part unspecified, without perforation or abscess without bleeding: K57.92

## 2016-09-05 HISTORY — DX: Hypothyroidism, unspecified: E03.9

## 2016-09-05 LAB — URINALYSIS, ROUTINE W REFLEX MICROSCOPIC
BACTERIA UA: NONE SEEN
BILIRUBIN URINE: NEGATIVE
Glucose, UA: NEGATIVE mg/dL
Ketones, ur: NEGATIVE mg/dL
NITRITE: NEGATIVE
PROTEIN: NEGATIVE mg/dL
Specific Gravity, Urine: 1.027 (ref 1.005–1.030)
pH: 5 (ref 5.0–8.0)

## 2016-09-05 LAB — COMPREHENSIVE METABOLIC PANEL
ALBUMIN: 3.6 g/dL (ref 3.5–5.0)
ALT: 28 U/L (ref 14–54)
ANION GAP: 8 (ref 5–15)
AST: 29 U/L (ref 15–41)
Alkaline Phosphatase: 92 U/L (ref 38–126)
BILIRUBIN TOTAL: 0.5 mg/dL (ref 0.3–1.2)
BUN: 20 mg/dL (ref 6–20)
CHLORIDE: 101 mmol/L (ref 101–111)
CO2: 27 mmol/L (ref 22–32)
Calcium: 9.3 mg/dL (ref 8.9–10.3)
Creatinine, Ser: 1.42 mg/dL — ABNORMAL HIGH (ref 0.44–1.00)
GFR calc Af Amer: 37 mL/min — ABNORMAL LOW (ref >60)
GFR calc non Af Amer: 32 mL/min — ABNORMAL LOW (ref >60)
GLUCOSE: 120 mg/dL — AB (ref 65–99)
POTASSIUM: 3.9 mmol/L (ref 3.5–5.1)
SODIUM: 136 mmol/L (ref 135–145)
TOTAL PROTEIN: 7.6 g/dL (ref 6.5–8.1)

## 2016-09-05 LAB — I-STAT TROPONIN, ED: TROPONIN I, POC: 0 ng/mL (ref 0.00–0.08)

## 2016-09-05 LAB — CBC
HEMATOCRIT: 39.8 % (ref 36.0–46.0)
HEMOGLOBIN: 13.1 g/dL (ref 12.0–15.0)
MCH: 27.5 pg (ref 26.0–34.0)
MCHC: 32.9 g/dL (ref 30.0–36.0)
MCV: 83.6 fL (ref 78.0–100.0)
Platelets: ADEQUATE 10*3/uL (ref 150–400)
RBC: 4.76 MIL/uL (ref 3.87–5.11)
RDW: 14 % (ref 11.5–15.5)
WBC: 8.2 10*3/uL (ref 4.0–10.5)

## 2016-09-05 LAB — TSH: TSH: 4.907 u[IU]/mL — ABNORMAL HIGH (ref 0.350–4.500)

## 2016-09-05 LAB — LIPASE, BLOOD: LIPASE: 21 U/L (ref 11–51)

## 2016-09-05 LAB — CBG MONITORING, ED: Glucose-Capillary: 108 mg/dL — ABNORMAL HIGH (ref 65–99)

## 2016-09-05 MED ORDER — KETOROLAC TROMETHAMINE 0.5 % OP SOLN
1.0000 [drp] | Freq: Every day | OPHTHALMIC | Status: DC
  Administered 2016-09-06 – 2016-09-07 (×2): 1 [drp] via OPHTHALMIC
  Filled 2016-09-05 (×2): qty 3

## 2016-09-05 MED ORDER — PIPERACILLIN-TAZOBACTAM 3.375 G IVPB 30 MIN
3.3750 g | Freq: Once | INTRAVENOUS | Status: AC
  Administered 2016-09-05: 3.375 g via INTRAVENOUS
  Filled 2016-09-05: qty 50

## 2016-09-05 MED ORDER — SODIUM CHLORIDE 0.9 % IV BOLUS (SEPSIS)
1000.0000 mL | Freq: Once | INTRAVENOUS | Status: AC
  Administered 2016-09-05: 1000 mL via INTRAVENOUS

## 2016-09-05 MED ORDER — ONDANSETRON HCL 4 MG/2ML IJ SOLN
4.0000 mg | Freq: Four times a day (QID) | INTRAMUSCULAR | Status: DC | PRN
  Administered 2016-09-06: 4 mg via INTRAVENOUS
  Filled 2016-09-05: qty 2

## 2016-09-05 MED ORDER — SODIUM CHLORIDE 0.9 % IV SOLN
INTRAVENOUS | Status: DC
  Administered 2016-09-05 – 2016-09-07 (×3): via INTRAVENOUS

## 2016-09-05 MED ORDER — ACETAMINOPHEN 650 MG RE SUPP: 650.0000 mg | Freq: Four times a day (QID) | RECTAL | Status: DC | PRN

## 2016-09-05 MED ORDER — LEVOTHYROXINE SODIUM 25 MCG PO TABS
137.0000 ug | ORAL_TABLET | Freq: Every day | ORAL | Status: DC
  Administered 2016-09-06 – 2016-09-08 (×3): 137 ug via ORAL
  Filled 2016-09-05 (×4): qty 1

## 2016-09-05 MED ORDER — ACETAMINOPHEN 325 MG PO TABS: 650.0000 mg | ORAL_TABLET | Freq: Four times a day (QID) | ORAL | Status: DC | PRN

## 2016-09-05 MED ORDER — AMLODIPINE BESYLATE 5 MG PO TABS
5.0000 mg | ORAL_TABLET | Freq: Every day | ORAL | Status: DC
  Administered 2016-09-05 – 2016-09-08 (×4): 5 mg via ORAL
  Filled 2016-09-05 (×4): qty 1

## 2016-09-05 MED ORDER — ATENOLOL 50 MG PO TABS
50.0000 mg | ORAL_TABLET | Freq: Every day | ORAL | Status: DC
  Administered 2016-09-05 – 2016-09-08 (×4): 50 mg via ORAL
  Filled 2016-09-05 (×2): qty 1
  Filled 2016-09-05: qty 2
  Filled 2016-09-05: qty 1

## 2016-09-05 MED ORDER — MORPHINE SULFATE (PF) 4 MG/ML IV SOLN
2.0000 mg | INTRAVENOUS | Status: DC | PRN
  Administered 2016-09-05: 2 mg via INTRAVENOUS
  Filled 2016-09-05: qty 1

## 2016-09-05 MED ORDER — SERTRALINE HCL 50 MG PO TABS
50.0000 mg | ORAL_TABLET | Freq: Every day | ORAL | Status: DC
  Administered 2016-09-06 – 2016-09-08 (×3): 50 mg via ORAL
  Filled 2016-09-05 (×3): qty 1

## 2016-09-05 MED ORDER — LORAZEPAM 1 MG PO TABS
1.0000 mg | ORAL_TABLET | Freq: Two times a day (BID) | ORAL | Status: DC
  Administered 2016-09-05 – 2016-09-08 (×6): 1 mg via ORAL
  Filled 2016-09-05 (×6): qty 1

## 2016-09-05 MED ORDER — GATIFLOXACIN 0.5 % OP SOLN: 1.0000 [drp] | Freq: Four times a day (QID) | OPHTHALMIC | Status: DC

## 2016-09-05 MED ORDER — MORPHINE SULFATE (PF) 4 MG/ML IV SOLN
4.0000 mg | Freq: Once | INTRAVENOUS | Status: AC
  Administered 2016-09-05: 4 mg via INTRAVENOUS
  Filled 2016-09-05: qty 1

## 2016-09-05 MED ORDER — DIFLUPREDNATE 0.05 % OP EMUL
1.0000 [drp] | Freq: Four times a day (QID) | OPHTHALMIC | Status: DC
  Administered 2016-09-06 – 2016-09-08 (×9): 1 [drp] via OPHTHALMIC
  Filled 2016-09-05 (×2): qty 5

## 2016-09-05 MED ORDER — PIPERACILLIN-TAZOBACTAM 3.375 G IVPB
3.3750 g | Freq: Three times a day (TID) | INTRAVENOUS | Status: DC
Start: 2016-09-06 — End: 2016-09-08
  Administered 2016-09-06 – 2016-09-08 (×7): 3.375 g via INTRAVENOUS
  Filled 2016-09-05 (×9): qty 50

## 2016-09-05 MED ORDER — INSULIN ASPART 100 UNIT/ML ~~LOC~~ SOLN: 0.0000 [IU] | Freq: Every day | SUBCUTANEOUS | Status: DC

## 2016-09-05 MED ORDER — IOPAMIDOL (ISOVUE-300) INJECTION 61%
INTRAVENOUS | Status: AC
  Administered 2016-09-05: 80 mL
  Filled 2016-09-05: qty 100

## 2016-09-05 MED ORDER — TRAZODONE HCL 50 MG PO TABS
50.0000 mg | ORAL_TABLET | Freq: Every day | ORAL | Status: DC
  Administered 2016-09-05 – 2016-09-07 (×3): 50 mg via ORAL
  Filled 2016-09-05 (×3): qty 1

## 2016-09-05 MED ORDER — ONDANSETRON HCL 4 MG PO TABS: 4.0000 mg | ORAL_TABLET | Freq: Four times a day (QID) | ORAL | Status: DC | PRN

## 2016-09-05 MED ORDER — ONDANSETRON HCL 4 MG/2ML IJ SOLN
4.0000 mg | Freq: Once | INTRAMUSCULAR | Status: AC
  Administered 2016-09-05: 4 mg via INTRAVENOUS
  Filled 2016-09-05: qty 2

## 2016-09-05 MED ORDER — INSULIN ASPART 100 UNIT/ML ~~LOC~~ SOLN: 0.0000 [IU] | Freq: Three times a day (TID) | SUBCUTANEOUS | Status: DC

## 2016-09-05 NOTE — Progress Notes (Signed)
Pharmacy Antibiotic Note Kelly Barker is a 83 y.o. female admitted on 09/05/2016 with [facility].  Pharmacy has been consulted for Zosyn dosing.  Plan: Zosyn 3.375g IV q8h (4 hour infusion).  Height: 5\' 4"  (162.6 cm) Weight: 182 lb (82.6 kg) IBW/kg (Calculated) : 54.7  Temp (24hrs), Avg:97.4 F (36.3 C), Min:97.4 F (36.3 C), Max:97.4 F (36.3 C)   Recent Labs Lab 09/05/16 1416  WBC 8.2  CREATININE 1.42*    Estimated Creatinine Clearance: 28.5 mL/min (A) (by C-G formula based on SCr of 1.42 mg/dL (H)).    Allergies  Allergen Reactions  . Crestor [Rosuvastatin] Other (See Comments)    Muscle cramps  . Lisinopril Other (See Comments)    Decreased GFR.  Marland Kitchen Pravachol [Pravastatin Sodium] Other (See Comments)    Leg cramps.  . Simvastatin Other (See Comments)    Leg cramps  . Tolectin [Tolmetin] Swelling     Thank you for allowing pharmacy to be a part of this patient's care.  Pollyann Samples, PharmD, BCPS 09/05/2016, 7:59 PM Pager: [phone]  '

## 2016-09-05 NOTE — ED Notes (Signed)
Pt to xray

## 2016-09-05 NOTE — ED Notes (Signed)
Admitting Provider at bedside. 

## 2016-09-05 NOTE — ED Notes (Signed)
SpO2 levels decr'd. Placed pt on 2Lpm via [place].

## 2016-09-05 NOTE — ED Provider Notes (Addendum)
[facility]-EMERGENCY DEPT Provider Note   CSN: [id number] Arrival date & time: 09/05/16  1336     History   Chief Complaint Chief Complaint  Patient presents with  . Abdominal Pain    HPI Kelly Barker is a 83 y.o. female.  The history is provided by the patient.  Abdominal Pain   This is a new problem. The current episode started more than 2 days ago. The problem occurs constantly. The problem has been gradually worsening. The pain is associated with eating. The pain is located in the LLQ. The quality of the pain is aching. The pain is severe. Associated symptoms include diarrhea and nausea. Pertinent negatives include fever, hematochezia, melena, vomiting, dysuria and frequency. The symptoms are aggravated by certain positions and bowel movements. The symptoms are relieved by NSAIDs.   83 year old female who presents with abdominal pain. History of diverticulosis, diabetes, chronic kidney disease, hypertension and hyperlipidemia. Reports that she has had constant left lower quadrant abdominal pain that has been worsening since 3 days ago after eating tomatoes. States it feels somewhat similar to when she has had diverticulitis in the past but this occurred on the right side. Associated with 1 day of diarrhea, but resolved with immodium. Abdominal pain improved with advil.  No fevers, chills, chest pain or difficulty breathing. However family has noted that she has looked more short of breath while doing activity during this time. No previous abdominal surgeries.    Past Medical History:  Diagnosis Date  . Alopecia (capitis) totalis   . Chronic headaches   . Chronic renal disease, stage 3, moderately decreased glomerular filtration rate between 30-59 mL/min/1.73 square meter   . Degenerative joint disease   . Depression   . Diabetes mellitus due to underlying condition with diabetic chronic kidney disease (HCC)   . Diverticulosis   . Hematuria, microscopic   . Hyperglycemia   .  Hyperlipidemia   . Hypertension   . Osteopenia   . Pneumonia   . Thrombocytopenia (HCC)   . Thyroid disease   . Vitamin D deficiency     There are no active problems to display for this patient.   History reviewed. No pertinent surgical history.  OB History    No data available       Home Medications    Prior to Admission medications   Medication Sig Start Date End Date Taking? Authorizing Provider  acetaminophen (TYLENOL) 325 MG tablet Take 325 mg by mouth every 6 (six) hours as needed for mild pain.   Yes [provider]  amLODipine (NORVASC) 5 MG tablet Take 5 mg by mouth daily.   Yes [provider]  atenolol (TENORMIN) 50 MG tablet Take 50 mg by mouth daily.   Yes [provider]  DUREZOL 0.05 % EMUL Place 1 drop into the left eye 4 (four) times daily. 08/27/16  Yes [provider]  ketorolac (ACULAR) 0.4 % SOLN Place 1 drop into the left eye at bedtime.  08/27/16  Yes [provider]  levothyroxine (SYNTHROID, LEVOTHROID) 137 MCG tablet Take 137 mcg by mouth daily before breakfast.    Yes [provider]  loperamide (IMODIUM) 2 MG capsule Take 2 mg by mouth as needed for diarrhea or loose stools.   Yes [provider]  LORazepam (ATIVAN) 1 MG tablet Take 1 mg by mouth 2 (two) times daily. Take one tablet twice daily.   Yes [provider]  metFORMIN (GLUCOPHAGE-XR) 500 MG 24 hr tablet Take 500  mg by mouth 2 (two) times daily with a meal.   Yes [provider]  moxifloxacin (VIGAMOX) 0.5 % ophthalmic solution Place 1 drop into the left eye 4 (four) times daily. 08/27/16  Yes [provider]  sertraline (ZOLOFT) 50 MG tablet Take 50 mg by mouth daily.   Yes [provider]  traZODone (DESYREL) 50 MG tablet Take 50 mg by mouth at bedtime.   Yes [provider]  triamterene-hydrochlorothiazide (DYAZIDE) 37.5-25 MG per capsule Take 1 capsule by mouth every morning.   Yes  [provider]    Family History Family History  Problem Relation Age of Onset  . Uterine cancer Mother   . Bladder Cancer Father   . Dementia Sister     Social History Social History  Substance Use Topics  . Smoking status: Never Smoker  . Smokeless tobacco: Never Used  . Alcohol use No     Allergies   Crestor [rosuvastatin]; Lisinopril; Pravachol [pravastatin sodium]; Simvastatin; and Tolectin [tolmetin]   Review of Systems Review of Systems  Constitutional: Negative for fever.  Respiratory: Negative for shortness of breath.   Cardiovascular: Negative for chest pain.  Gastrointestinal: Positive for abdominal pain, diarrhea and nausea. Negative for hematochezia, melena and vomiting.  Genitourinary: Negative for dysuria and frequency.  All other systems reviewed and are negative.    Physical Exam Updated Vital Signs BP (!) 143/84   Pulse 77   Temp (!) 97.4 F (36.3 C) (Oral)   Resp (!) 21   Ht 5\' 4"  (1.626 m)   Wt 82.6 kg (182 lb)   SpO2 92%   BMI 31.24 kg/m   Physical Exam Physical Exam  Nursing note and vitals reviewed. Constitutional: Well developed, well nourished, non-toxic, and in no acute distress Head: Normocephalic and atraumatic.  Mouth/Throat: Oropharynx is clear and moist.  Neck: Normal range of motion. Neck supple.  Cardiovascular: Normal rate and regular rhythm.   Pulmonary/Chest: Effort normal and breath sounds normal.  Abdominal: Soft. There is LLQ tenderness. There is no rebound and no guarding.  Musculoskeletal: Normal range of motion.  Neurological: Alert, no facial droop, fluent speech, moves all extremities symmetrically Skin: Skin is warm and dry.  Psychiatric: Cooperative   ED Treatments / Results  Labs (all labs ordered are listed, but only abnormal results are displayed) Labs Reviewed  COMPREHENSIVE METABOLIC PANEL - Abnormal; Notable for the following:       Result Value   Glucose, Bld 120 (*)    Creatinine,  Ser 1.42 (*)    GFR calc non Af Amer 32 (*)    GFR calc Af Amer 37 (*)    All other components within normal limits  LIPASE, BLOOD  CBC  URINALYSIS, ROUTINE W REFLEX MICROSCOPIC  I-STAT TROPONIN, ED    EKG  EKG Interpretation None       Radiology Dg Chest 2 View  Result Date: 09/05/2016 CLINICAL DATA:  Left lower quadrant abdominal pain and dyspnea EXAM: CHEST  2 VIEW COMPARISON:  12/14/2010 chest radiograph. FINDINGS: Stable borderline mild cardiomegaly. Aortic atherosclerosis. Otherwise normal mediastinal contour. No pneumothorax. No pleural effusion. No overt pulmonary edema. No acute consolidative airspace disease. IMPRESSION: Stable borderline mild cardiomegaly without overt pulmonary edema. No active pulmonary disease. Electronically Signed   By: Delbert Phenix M.D.   On: 09/05/2016 17:50   Ct Abdomen Pelvis W Contrast  Result Date: 09/05/2016 CLINICAL DATA:  Left-sided abdominal pain and nausea EXAM: CT ABDOMEN AND PELVIS WITH CONTRAST TECHNIQUE: Multidetector  CT imaging of the abdomen and pelvis was performed using the standard protocol following bolus administration of intravenous contrast. CONTRAST:  65mL ISOVUE-300 IOPAMIDOL (ISOVUE-300) INJECTION 61% COMPARISON:  August 23, 2012. FINDINGS: Lower chest: There is atelectatic change in the left lung base. There is no lung base edema or consolidation. There is a fairly small focal hiatal hernia. There is a small pericardial effusion. Hepatobiliary: There is evidence of a degree of hepatic steatosis. No focal liver lesions are evident. Gallbladder wall is not appreciably thickened. There is no biliary duct dilatation. Common bile duct appears upper normal without mass or calculus evident. Pancreas: No pancreatic mass or inflammatory focus. Spleen: No splenic lesions are evident. Adrenals/Urinary Tract: Adrenals appear unremarkable bilaterally. There is moderate renal sinus fat bilaterally. There is no appreciable renal mass or  hydronephrosis on either side. There is no renal or ureteral calculus on either side. Urinary bladder is midline with wall thickness within normal limits. Stomach/Bowel: There is extensive sigmoid diverticulosis. There is focal diverticulitis with wall thickening and mesenteric thickening at the junction of the descending colon and sigmoid colon in the left abdominal- pelvic junction. There is no abscess in this area. On axial slice 68 series 3, a tiny focus of microperforation is noted. Elsewhere, there is no bowel wall or mesenteric thickening. There are multiple diverticula scattered throughout the remainder of the colon, particularly on the right, without diverticulitis elsewhere. No bowel obstruction. No free air beyond the minimal free air in the micro perforation site in the sigmoid colon. No portal venous air. Vascular/Lymphatic: There is atherosclerotic calcification in the aorta and common iliac arteries. No aneurysm evident. Major mesenteric vessels are patent, although there are foci of atherosclerotic calcification in proximal mesenteric arteries. There is no appreciable adenopathy in the abdomen or pelvis. Reproductive: Uterus is anteverted.  No evident pelvic mass. Other: Appendix appears normal. No abscess or ascites evident in the abdomen or pelvis. Musculoskeletal: There are foci of degenerative change in the lumbar spine. There is spinal stenosis at L4-5 and L5-S1 due to diffuse disc protrusion as well as bony hypertrophy. There are no evident blastic or lytic bone lesions. There is no intramuscular or abdominal wall lesion. IMPRESSION: 1. Diverticulitis focally at the junction of the descending colon and sigmoid colon. No abscess. There is a tiny focus of microperforation in this area, however. 2. Multiple colonic diverticula elsewhere without diverticulitis elsewhere. No bowel obstruction. No abscess elsewhere in the abdomen or pelvis. Appendix appears normal. 3.  No renal or ureteral calculus.   No hydronephrosis. 4.  Fairly small hiatal hernia. 5.  Small pericardial effusion. 6.  Aortoiliac atherosclerosis. 7.  Spinal stenosis at L4-5 and L5-S1, multifactorial. Aortic Atherosclerosis (ICD10-I70.0). These results were called by telephone at the time of interpretation on 09/05/2016 at 5:47 pm to Dr. Crista Curb , who verbally acknowledged these results. Electronically Signed   By: Bretta Bang III M.D.   On: 09/05/2016 17:47    Procedures Procedures (including critical care time)  Medications Ordered in ED Medications  piperacillin-tazobactam (ZOSYN) IVPB 3.375 g (3.375 g Intravenous New Bag/Given 09/05/16 1803)  sodium chloride 0.9 % bolus 1,000 mL (0 mLs Intravenous Stopped 09/05/16 1800)  ondansetron (ZOFRAN) injection 4 mg (4 mg Intravenous Given 09/05/16 1624)  morphine 4 MG/ML injection 4 mg (4 mg Intravenous Given 09/05/16 1624)  iopamidol (ISOVUE-300) 61 % injection (80 mLs  Contrast Given 09/05/16 1715)     Initial Impression / Assessment and Plan / ED Course  I have  reviewed the triage vital signs and the nursing notes.  Pertinent labs & imaging results that were available during my care of the patient were reviewed by me and considered in my medical decision making (see chart for details).     83 year old female who presents with left lower quadrant abdominal pain. He is nontoxic in no acute distress and vital signs are stable. She does have exquisite tenderness in the left lower quadrant with some mild guarding. CT abdomen pelvis obtained and visualized. It is reviewed with radiology. She has evidence of acute diverticulitis with microperforation. No significant leukocytosis on blood work. She does have mild achy I with a creatinine of 1.4. Given IV fluids anti-emetics and pain medications with improvement in her symptoms. Given microperforation is started on Zosyn. Discussed with Dr. Lindie Spruce from general surgery who will follow patient in the hospital. [facility]  is consulted for admission. Spoke with Dr. Ophelia Charter.  Final Clinical Impressions(s) / ED Diagnoses   Final diagnoses:  Acute diverticulitis    New Prescriptions New Prescriptions   No medications on file     Lavera Guise, MD 09/05/16 1804    Lavera Guise, MD 09/05/16 [phone]

## 2016-09-05 NOTE — H&P (Signed)
History and Physical    Kelly Barker ZOX:[id number] DOB: [birth year] DOA: 09/05/2016  PCP: Merlene Laughter, MD Consultants:  Randon Goldsmith - eye Patient coming from:  Home - lives alone; NOK: Son or daughter-in-law, [phone]  Chief Complaint: abdominal pain  HPI: Kelly Barker is a 83 y.o. female with medical history significant of DM; HTN; HLD; hypothyroidism; CKD stage 3; and depression/anxiety presenting with abdominal pain in the LLQ that started over the weekend and got worse this AM.  The pain started Friday afternoon - she ate some tomoatoes for lunch and she had severe diarrhea (one episode) that afternoon.  Abdominal pain started thereafter.  It has been aching throughout, but today it was worse, maybe 5/10 all day today.  Some nausea, no vomiting.  No further diarrhea since Friday.  She did have a normal BM on Saturday.  No fever.    She had cataract surgery on Tuesday of last week on the left; she had the right eye done last June.   ED Course:  CT with acute diverticulitis and microperforation.  IVF, anti-emetics, pain medication.  D/W Dr. Lindie Spruce (gen surg) who will follow patient in the hospital.  Review of Systems: As per HPI; otherwise review of systems reviewed and negative.   Ambulatory Status:   Ambulates without assistance  Past Medical History:  Diagnosis Date  . Alopecia (capitis) totalis   . Chronic headaches   . Chronic renal disease, stage 3, moderately decreased glomerular filtration rate between 30-59 mL/min/1.73 square meter   . Degenerative joint disease   . Depression   . Diabetes mellitus due to underlying condition with diabetic chronic kidney disease (HCC)   . Diverticulosis   . Hematuria, microscopic   . Hyperlipidemia   . Hypertension   . Hypothyroidism   . Osteopenia   . Pneumonia   . Thrombocytopenia (HCC)   . Vitamin D deficiency     Past Surgical History:  Procedure Laterality Date  . CATARACT EXTRACTION, BILATERAL  6/18, 8/18    Social  History   Social History  . Marital status: Divorced    Spouse name: N/A  . Number of children: N/A  . Years of education: N/A   Occupational History  . retired    Social History Main Topics  . Smoking status: Never Smoker  . Smokeless tobacco: Never Used  . Alcohol use No  . Drug use: No  . Sexual activity: Not on file   Other Topics Concern  . Not on file   Social History Narrative  . No narrative on file    Allergies  Allergen Reactions  . Crestor [Rosuvastatin] Other (See Comments)    Muscle cramps  . Lisinopril Other (See Comments)    Decreased GFR.  Marland Kitchen Pravachol [Pravastatin Sodium] Other (See Comments)    Leg cramps.  . Simvastatin Other (See Comments)    Leg cramps  . Tolectin [Tolmetin] Swelling    Family History  Problem Relation Age of Onset  . Uterine cancer Mother   . Bladder Cancer Father   . Dementia Sister     Prior to Admission medications   Medication Sig Start Date End Date Taking? Authorizing Provider  acetaminophen (TYLENOL) 325 MG tablet Take 325 mg by mouth every 6 (six) hours as needed for mild pain.   Yes [provider]  amLODipine (NORVASC) 5 MG tablet Take 5 mg by mouth daily.   Yes [provider]  atenolol (TENORMIN) 50 MG tablet Take 50 mg by mouth  daily.   Yes [provider]  DUREZOL 0.05 % EMUL Place 1 drop into the left eye 4 (four) times daily. 08/27/16  Yes [provider]  ketorolac (ACULAR) 0.4 % SOLN Place 1 drop into the left eye at bedtime.  08/27/16  Yes [provider]  levothyroxine (SYNTHROID, LEVOTHROID) 137 MCG tablet Take 137 mcg by mouth daily before breakfast.    Yes [provider]  loperamide (IMODIUM) 2 MG capsule Take 2 mg by mouth as needed for diarrhea or loose stools.   Yes [provider]  LORazepam (ATIVAN) 1 MG tablet Take 1 mg by mouth 2 (two) times daily. Take one tablet twice daily.   Yes [provider]  metFORMIN (GLUCOPHAGE-XR)  500 MG 24 hr tablet Take 500 mg by mouth 2 (two) times daily with a meal.   Yes [provider]  moxifloxacin (VIGAMOX) 0.5 % ophthalmic solution Place 1 drop into the left eye 4 (four) times daily. 08/27/16  Yes [provider]  sertraline (ZOLOFT) 50 MG tablet Take 50 mg by mouth daily.   Yes [provider]  traZODone (DESYREL) 50 MG tablet Take 50 mg by mouth at bedtime.   Yes [provider]  triamterene-hydrochlorothiazide (DYAZIDE) 37.5-25 MG per capsule Take 1 capsule by mouth every morning.   Yes [provider]    Physical Exam: Vitals:   09/05/16 1730 09/05/16 1800 09/05/16 1854 09/05/16 1900  BP: (!) 143/84 (!) 124/106 (!) 143/66 (!) 153/70  Pulse: 77 71 68 66  Resp: (!) 21 (!) 21 17 16   Temp:      TempSrc:      SpO2: 92% 91% 94% 93%  Weight:      Height:         General:  Appears calm and comfortable and is NAD; she is wearing a wig Eyes:  PERRL, EOMI, normal lids, iris ENT:  grossly normal hearing, lips & tongue, mmm; appropriate dentition Neck:  no LAD, masses or thyromegaly Cardiovascular:  RRR, no m/r/g. No LE edema.  Respiratory:   CTA bilaterally with no wheezes/rales/rhonchi.  Normal respiratory effort. Abdomen:  soft, tender primarily in the LLQ but also diffusely, ND, NABS Skin:  no rash or induration seen on limited exam Musculoskeletal:  grossly normal tone BUE/BLE, good ROM, no bony abnormality Lower extremity:  No LE edema.  Limited foot exam with no ulcerations.  2+ distal pulses. Psychiatric:  grossly normal mood and affect, speech fluent and appropriate, AOx3 Neurologic:  CN 2-12 grossly intact, moves all extremities in coordinated fashion, sensation intact    Radiological Exams on Admission: Dg Chest 2 View  Result Date: 09/05/2016 CLINICAL DATA:  Left lower quadrant abdominal pain and dyspnea EXAM: CHEST  2 VIEW COMPARISON:  12/14/2010 chest radiograph. FINDINGS: Stable borderline mild cardiomegaly.  Aortic atherosclerosis. Otherwise normal mediastinal contour. No pneumothorax. No pleural effusion. No overt pulmonary edema. No acute consolidative airspace disease. IMPRESSION: Stable borderline mild cardiomegaly without overt pulmonary edema. No active pulmonary disease. Electronically Signed   By: Delbert Phenix M.D.   On: 09/05/2016 17:50   Ct Abdomen Pelvis W Contrast  Result Date: 09/05/2016 CLINICAL DATA:  Left-sided abdominal pain and nausea EXAM: CT ABDOMEN AND PELVIS WITH CONTRAST TECHNIQUE: Multidetector CT imaging of the abdomen and pelvis was performed using the standard protocol following bolus administration of intravenous contrast. CONTRAST:  57mL ISOVUE-300 IOPAMIDOL (ISOVUE-300) INJECTION 61% COMPARISON:  August 23, 2012. FINDINGS: Lower chest: There is atelectatic change in the left  lung base. There is no lung base edema or consolidation. There is a fairly small focal hiatal hernia. There is a small pericardial effusion. Hepatobiliary: There is evidence of a degree of hepatic steatosis. No focal liver lesions are evident. Gallbladder wall is not appreciably thickened. There is no biliary duct dilatation. Common bile duct appears upper normal without mass or calculus evident. Pancreas: No pancreatic mass or inflammatory focus. Spleen: No splenic lesions are evident. Adrenals/Urinary Tract: Adrenals appear unremarkable bilaterally. There is moderate renal sinus fat bilaterally. There is no appreciable renal mass or hydronephrosis on either side. There is no renal or ureteral calculus on either side. Urinary bladder is midline with wall thickness within normal limits. Stomach/Bowel: There is extensive sigmoid diverticulosis. There is focal diverticulitis with wall thickening and mesenteric thickening at the junction of the descending colon and sigmoid colon in the left abdominal- pelvic junction. There is no abscess in this area. On axial slice 68 series 3, a tiny focus of microperforation is  noted. Elsewhere, there is no bowel wall or mesenteric thickening. There are multiple diverticula scattered throughout the remainder of the colon, particularly on the right, without diverticulitis elsewhere. No bowel obstruction. No free air beyond the minimal free air in the micro perforation site in the sigmoid colon. No portal venous air. Vascular/Lymphatic: There is atherosclerotic calcification in the aorta and common iliac arteries. No aneurysm evident. Major mesenteric vessels are patent, although there are foci of atherosclerotic calcification in proximal mesenteric arteries. There is no appreciable adenopathy in the abdomen or pelvis. Reproductive: Uterus is anteverted.  No evident pelvic mass. Other: Appendix appears normal. No abscess or ascites evident in the abdomen or pelvis. Musculoskeletal: There are foci of degenerative change in the lumbar spine. There is spinal stenosis at L4-5 and L5-S1 due to diffuse disc protrusion as well as bony hypertrophy. There are no evident blastic or lytic bone lesions. There is no intramuscular or abdominal wall lesion. IMPRESSION: 1. Diverticulitis focally at the junction of the descending colon and sigmoid colon. No abscess. There is a tiny focus of microperforation in this area, however. 2. Multiple colonic diverticula elsewhere without diverticulitis elsewhere. No bowel obstruction. No abscess elsewhere in the abdomen or pelvis. Appendix appears normal. 3.  No renal or ureteral calculus.  No hydronephrosis. 4.  Fairly small hiatal hernia. 5.  Small pericardial effusion. 6.  Aortoiliac atherosclerosis. 7.  Spinal stenosis at L4-5 and L5-S1, multifactorial. Aortic Atherosclerosis (ICD10-I70.0). These results were called by telephone at the time of interpretation on 09/05/2016 at 5:47 pm to Dr. Crista Curb , who verbally acknowledged these results. Electronically Signed   By: Bretta Bang III M.D.   On: 09/05/2016 17:47    EKG: Independently reviewed.  NSR with  rate 64; nonspecific ST changes with no evidence of acute ischemia   Labs on Admission: I have personally reviewed the available labs and imaging studies at the time of the admission.  Pertinent labs:   Glucose 120 Creatinine 1.42; GFR 32  Assessment/Plan Principal Problem:   Diverticulitis Active Problems:   Essential hypertension   Diabetes mellitus type 2 in obese (HCC)   Hypothyroidism   CKD (chronic kidney disease) stage 3, GFR 30-59 ml/min   History of left cataract surgery   Diverticulitis -Patient's symptoms are c/w diverticulitis and her CT supports this as a diagnosis -She does not have any SIRS criteria  -CT does show any area of microperforation -For now, will give bowel rest, IVF, pain medication with morphine, nausea  medication withZofran, and treat with Zosyn for intraabdominal infection -If not improving, she may need repeat CT  -General surgery will also be following the patient  HTN -Continue Norvasc and Atenolol -Hold Diazide due to CKD; suggest discontinuation -Consider addition of ACE/ARB, although it appears that Lisinopril caused worsening renal function in the past and so this might not be a good option for her  DM -Hold Metformin -Cover with moderate-scale SSI for now -Will not check A1c as this is unlikely to seriously impact her short- or long-term outcomes  CKD -Prior value was 1.24 but this was in 2011 -Not appreciably different at this time so doubt AKI component -Will follow with repeat BMP in AM  Hypothyroidism -Check TSH -Continue Synthroid at current dose for now  Recent left cataract surgery -Continue drops as prescribed -Note: one of the drops is an NSAID and so this is just something to be away of   DVT prophylaxis: SCDs pending possible (fairly unlikely) need for surgery Code Status:  DNR - confirmed with patient/family Family Communication: Son and daughter-in-law present throughout evaluation  Disposition Plan:  Home once  clinically improved Consults called: Surgery  Admission status: It is my clinical opinion that referral for OBSERVATION is reasonable and necessary in this patient based on the above information provided. The aforementioned taken together are felt to place the patient at high risk for further clinical deterioration. However it is anticipated that the patient may be medically stable for discharge from the hospital within 24 to 48 hours.    Jonah Blue MD [facility]  If note is complete, please contact covering daytime or nighttime physician. [email or web address] Password [facility]  09/05/2016, 7:41 PM

## 2016-09-05 NOTE — ED Triage Notes (Signed)
The patient has a history of diverticulitis and she ate some tomatoes on Friday and began with abdominal pain, and diarrhea.  She rates her pain 5/10.  She did take imodium and tylenol for the diarrhea and pain.  Daughter in law is with her and says the patient is always SOB with exertion but she is even more now since she has been having this pain.  The patient is able to make full sentences without being SOB.

## 2016-09-05 NOTE — Progress Notes (Signed)
Very mild looking diverticulitis without an abscess.  Should respond to IV antibiotics.  Will perform full consultation later.  Marta Lamas. Gae Bon, MD, FACS [phone] [facility]

## 2016-09-05 NOTE — ED Notes (Signed)
Pt requesting to wait a little bit before providing urine sample. States she cant go yet.

## 2016-09-06 ENCOUNTER — Encounter (HOSPITAL_COMMUNITY): Payer: Self-pay | Admitting: General Practice

## 2016-09-06 DIAGNOSIS — K5792 Diverticulitis of intestine, part unspecified, without perforation or abscess without bleeding: Secondary | ICD-10-CM

## 2016-09-06 DIAGNOSIS — E1169 Type 2 diabetes mellitus with other specified complication: Secondary | ICD-10-CM | POA: Diagnosis not present

## 2016-09-06 DIAGNOSIS — M4807 Spinal stenosis, lumbosacral region: Secondary | ICD-10-CM | POA: Diagnosis present

## 2016-09-06 DIAGNOSIS — Z7984 Long term (current) use of oral hypoglycemic drugs: Secondary | ICD-10-CM | POA: Diagnosis not present

## 2016-09-06 DIAGNOSIS — M8580 Other specified disorders of bone density and structure, unspecified site: Secondary | ICD-10-CM | POA: Diagnosis present

## 2016-09-06 DIAGNOSIS — F419 Anxiety disorder, unspecified: Secondary | ICD-10-CM | POA: Diagnosis present

## 2016-09-06 DIAGNOSIS — E039 Hypothyroidism, unspecified: Secondary | ICD-10-CM | POA: Diagnosis present

## 2016-09-06 DIAGNOSIS — E669 Obesity, unspecified: Secondary | ICD-10-CM

## 2016-09-06 DIAGNOSIS — Z888 Allergy status to other drugs, medicaments and biological substances status: Secondary | ICD-10-CM | POA: Diagnosis not present

## 2016-09-06 DIAGNOSIS — E1122 Type 2 diabetes mellitus with diabetic chronic kidney disease: Secondary | ICD-10-CM | POA: Diagnosis present

## 2016-09-06 DIAGNOSIS — I10 Essential (primary) hypertension: Secondary | ICD-10-CM

## 2016-09-06 DIAGNOSIS — I700 Atherosclerosis of aorta: Secondary | ICD-10-CM | POA: Diagnosis present

## 2016-09-06 DIAGNOSIS — M1990 Unspecified osteoarthritis, unspecified site: Secondary | ICD-10-CM | POA: Diagnosis present

## 2016-09-06 DIAGNOSIS — Z9842 Cataract extraction status, left eye: Secondary | ICD-10-CM | POA: Diagnosis not present

## 2016-09-06 DIAGNOSIS — D638 Anemia in other chronic diseases classified elsewhere: Secondary | ICD-10-CM | POA: Diagnosis present

## 2016-09-06 DIAGNOSIS — K5720 Diverticulitis of large intestine with perforation and abscess without bleeding: Secondary | ICD-10-CM | POA: Diagnosis present

## 2016-09-06 DIAGNOSIS — Z79899 Other long term (current) drug therapy: Secondary | ICD-10-CM | POA: Diagnosis not present

## 2016-09-06 DIAGNOSIS — L659 Nonscarring hair loss, unspecified: Secondary | ICD-10-CM | POA: Diagnosis present

## 2016-09-06 DIAGNOSIS — E785 Hyperlipidemia, unspecified: Secondary | ICD-10-CM | POA: Diagnosis present

## 2016-09-06 DIAGNOSIS — F329 Major depressive disorder, single episode, unspecified: Secondary | ICD-10-CM | POA: Diagnosis present

## 2016-09-06 DIAGNOSIS — N183 Chronic kidney disease, stage 3 (moderate): Secondary | ICD-10-CM | POA: Diagnosis present

## 2016-09-06 DIAGNOSIS — I129 Hypertensive chronic kidney disease with stage 1 through stage 4 chronic kidney disease, or unspecified chronic kidney disease: Secondary | ICD-10-CM | POA: Diagnosis present

## 2016-09-06 DIAGNOSIS — Z6831 Body mass index (BMI) 31.0-31.9, adult: Secondary | ICD-10-CM | POA: Diagnosis not present

## 2016-09-06 DIAGNOSIS — E559 Vitamin D deficiency, unspecified: Secondary | ICD-10-CM | POA: Diagnosis present

## 2016-09-06 DIAGNOSIS — Z66 Do not resuscitate: Secondary | ICD-10-CM | POA: Diagnosis present

## 2016-09-06 LAB — CBC
HCT: 35 % — ABNORMAL LOW (ref 36.0–46.0)
Hemoglobin: 11.1 g/dL — ABNORMAL LOW (ref 12.0–15.0)
MCH: 27.1 pg (ref 26.0–34.0)
MCHC: 31.7 g/dL (ref 30.0–36.0)
MCV: 85.6 fL (ref 78.0–100.0)
RBC: 4.09 MIL/uL (ref 3.87–5.11)
RDW: 14.5 % (ref 11.5–15.5)
WBC: 6.6 10*3/uL (ref 4.0–10.5)

## 2016-09-06 LAB — BASIC METABOLIC PANEL
Anion gap: 6 (ref 5–15)
BUN: 17 mg/dL (ref 6–20)
CHLORIDE: 105 mmol/L (ref 101–111)
CO2: 26 mmol/L (ref 22–32)
CREATININE: 1.34 mg/dL — AB (ref 0.44–1.00)
Calcium: 7.9 mg/dL — ABNORMAL LOW (ref 8.9–10.3)
GFR calc non Af Amer: 34 mL/min — ABNORMAL LOW (ref >60)
GFR, EST AFRICAN AMERICAN: 40 mL/min — AB (ref >60)
Glucose, Bld: 126 mg/dL — ABNORMAL HIGH (ref 65–99)
POTASSIUM: 3.7 mmol/L (ref 3.5–5.1)
SODIUM: 137 mmol/L (ref 135–145)

## 2016-09-06 LAB — GLUCOSE, CAPILLARY
GLUCOSE-CAPILLARY: 90 mg/dL (ref 65–99)
GLUCOSE-CAPILLARY: 91 mg/dL (ref 65–99)
Glucose-Capillary: 119 mg/dL — ABNORMAL HIGH (ref 65–99)
Glucose-Capillary: 100 mg/dL — ABNORMAL HIGH (ref 65–99)

## 2016-09-06 MED ORDER — MOXIFLOXACIN HCL 0.5 % OP SOLN
1.0000 [drp] | Freq: Four times a day (QID) | OPHTHALMIC | Status: DC
  Administered 2016-09-06 – 2016-09-08 (×9): 1 [drp] via OPHTHALMIC
  Filled 2016-09-06: qty 3

## 2016-09-06 MED ORDER — INSULIN ASPART 100 UNIT/ML ~~LOC~~ SOLN
0.0000 [IU] | SUBCUTANEOUS | Status: DC
  Administered 2016-09-07 – 2016-09-08 (×3): 1 [IU] via SUBCUTANEOUS

## 2016-09-06 MED ORDER — HEPARIN SODIUM (PORCINE) 5000 UNIT/ML IJ SOLN
5000.0000 [IU] | Freq: Three times a day (TID) | INTRAMUSCULAR | Status: DC
  Administered 2016-09-06 – 2016-09-08 (×6): 5000 [IU] via SUBCUTANEOUS
  Filled 2016-09-06 (×6): qty 1

## 2016-09-06 NOTE — Progress Notes (Signed)
PROGRESS NOTE    Kelly Barker  ZOX:[id number] DOB: [birth year] DOA: 09/05/2016 PCP: Merlene Laughter, MD   Outpatient Specialists:     Brief Narrative:  Kelly Barker is a 83 y.o. female with medical history significant of DM; HTN; HLD; hypothyroidism; CKD stage 3; and depression/anxiety presenting with abdominal pain in the LLQ that started over the weekend and got worse this AM.  The pain started Friday afternoon - she ate some tomoatoes for lunch and she had severe diarrhea (one episode) that afternoon.  Abdominal pain started thereafter.  It has been aching throughout, but today it was worse, maybe 5/10 all day today.  Some nausea, no vomiting.  No further diarrhea since Friday.  She did have a normal BM on Saturday.  No fever.     Assessment & Plan:   Principal Problem:   Diverticulitis Active Problems:   Essential hypertension   Diabetes mellitus type 2 in obese (HCC)   Hypothyroidism   CKD (chronic kidney disease) stage 3, GFR 30-59 ml/min   History of left cataract surgery   Diverticulitis -Patient's symptoms are c/w diverticulitis and her CT supports this as a diagnosis -CT does show any area of microperforation -For now, will give bowel rest, IVF, pain medication with morphine, nausea medication with Zofran, and treat with Zosyn for intraabdominal infection -General surgery will also be following the patient  HTN -Continue Norvasc and Atenolol -Hold Diazide due to CKD; suggest discontinuation -Consider addition of ACE/ARB, although it appears that Lisinopril caused worsening renal function in the past and so this might not be a good option for her  DM -Hold Metformin -Cover with moderate-scale SSI for now   CKD stage III -Prior value was 1.24 but this was in 2011 -Not appreciably different at this time so doubt AKI component -follow BMP  Hypothyroidism -TSH: 4.907 -outpatient follow up -Continue Synthroid at current dose for now  Recent left  cataract surgery -Continue drops as prescribed    DVT prophylaxis:  SQ Heparin  Code Status: DNR   Family Communication: None at bedside  Disposition Plan:  Suspect 2-3 days in hospital for IV abx and diet advancement   Consultants:  General surgery  Subjective: Abdominal pain slightly improved except with palpation   Objective: Vitals:   09/06/16 0530 09/06/16 0647 09/06/16 0727 09/06/16 0847  BP: (!) 108/53 (!) 124/50 (!) 119/49 (!) 136/48  Pulse: (!) 57 (!) 58 (!) 57 63  Resp: 16 16 16    Temp:    98 F (36.7 C)  TempSrc:      SpO2: 97% 97% 96% 92%  Weight:      Height:        Intake/Output Summary (Last 24 hours) at 09/06/16 1117 Last data filed at 09/05/16 1900  Gross per 24 hour  Intake             1050 ml  Output                0 ml  Net             1050 ml   Filed Weights   09/05/16 1402  Weight: 82.6 kg (182 lb)    Examination:  General exam: Appears calm and comfortable  Respiratory system: Clear to auscultation. Respiratory effort normal. Cardiovascular system: S1 & S2 heard, RRR. No JVD, murmurs, rubs, gallops or clicks. No pedal edema. Gastrointestinal system: no peritoneal signs but tender to palpation Central nervous system: Alert and oriented. No focal neurological  deficits. Extremities: Symmetric 5 x 5 power. Skin: No rashes, lesions or ulcers Psychiatry: Judgement and insight appear normal. Mood & affect appropriate.     Data Reviewed: I have personally reviewed following labs and imaging studies  CBC:  Recent Labs Lab 09/05/16 1416 09/06/16 0614  WBC 8.2 6.6  HGB 13.1 11.1*  HCT 39.8 35.0*  MCV 83.6 85.6  PLT PLATELET CLUMPS NOTED ON SMEAR, COUNT APPEARS ADEQUATE PLATELET CLUMPING, SUGGEST RECOLLECTION OF SAMPLE IN CITRATE TUBE.   Basic Metabolic Panel:  Recent Labs Lab 09/05/16 1416 09/06/16 0614  NA 136 137  K 3.9 3.7  CL 101 105  CO2 27 26  GLUCOSE 120* 126*  BUN 20 17  CREATININE 1.42* 1.34*  CALCIUM 9.3  7.9*   GFR: Estimated Creatinine Clearance: 30.2 mL/min (A) (by C-G formula based on SCr of 1.34 mg/dL (H)). Liver Function Tests:  Recent Labs Lab 09/05/16 1416  AST 29  ALT 28  ALKPHOS 92  BILITOT 0.5  PROT 7.6  ALBUMIN 3.6    Recent Labs Lab 09/05/16 1416  LIPASE 21   No results for input(s): AMMONIA in the last 168 hours. Coagulation Profile: No results for input(s): INR, PROTIME in the last 168 hours. Cardiac Enzymes: No results for input(s): CKTOTAL, CKMB, CKMBINDEX, TROPONINI in the last 168 hours. BNP (last 3 results) No results for input(s): PROBNP in the last 8760 hours. HbA1C: No results for input(s): HGBA1C in the last 72 hours. CBG:  Recent Labs Lab 09/05/16 2138 09/06/16 0855  GLUCAP 108* 119*   Lipid Profile: No results for input(s): CHOL, HDL, LDLCALC, TRIG, CHOLHDL, LDLDIRECT in the last 72 hours. Thyroid Function Tests:  Recent Labs  09/05/16 2200  TSH 4.907*   Anemia Panel: No results for input(s): VITAMINB12, FOLATE, FERRITIN, TIBC, IRON, RETICCTPCT in the last 72 hours. Urine analysis:    Component Value Date/Time   COLORURINE STRAW (A) 09/05/2016 1950   APPEARANCEUR CLEAR 09/05/2016 1950   LABSPEC 1.027 09/05/2016 1950   PHURINE 5.0 09/05/2016 1950   GLUCOSEU NEGATIVE 09/05/2016 1950   HGBUR SMALL (A) 09/05/2016 1950   BILIRUBINUR NEGATIVE 09/05/2016 1950   KETONESUR NEGATIVE 09/05/2016 1950   PROTEINUR NEGATIVE 09/05/2016 1950   UROBILINOGEN 0.2 11/28/2009 1233   NITRITE NEGATIVE 09/05/2016 1950   LEUKOCYTESUR TRACE (A) 09/05/2016 1950     )No results found for this or any previous visit (from the past 240 hour(s)).    Anti-infectives    Start     Dose/Rate Route Frequency Ordered Stop   09/06/16 0100  piperacillin-tazobactam (ZOSYN) IVPB 3.375 g     3.375 g 12.5 mL/hr over 240 Minutes Intravenous Every 8 hours 09/05/16 1955     09/05/16 1800  piperacillin-tazobactam (ZOSYN) IVPB 3.375 g     3.375 g 100 mL/hr over 30  Minutes Intravenous  Once 09/05/16 1751 09/05/16 1900       Radiology Studies: Dg Chest 2 View  Result Date: 09/05/2016 CLINICAL DATA:  Left lower quadrant abdominal pain and dyspnea EXAM: CHEST  2 VIEW COMPARISON:  12/14/2010 chest radiograph. FINDINGS: Stable borderline mild cardiomegaly. Aortic atherosclerosis. Otherwise normal mediastinal contour. No pneumothorax. No pleural effusion. No overt pulmonary edema. No acute consolidative airspace disease. IMPRESSION: Stable borderline mild cardiomegaly without overt pulmonary edema. No active pulmonary disease. Electronically Signed   By: Delbert Phenix M.D.   On: 09/05/2016 17:50   Ct Abdomen Pelvis W Contrast  Result Date: 09/05/2016 CLINICAL DATA:  Left-sided abdominal pain and nausea EXAM: CT ABDOMEN  AND PELVIS WITH CONTRAST TECHNIQUE: Multidetector CT imaging of the abdomen and pelvis was performed using the standard protocol following bolus administration of intravenous contrast. CONTRAST:  80mL ISOVUE-300 IOPAMIDOL (ISOVUE-300) INJECTION 61% COMPARISON:  August 23, 2012. FINDINGS: Lower chest: There is atelectatic change in the left lung base. There is no lung base edema or consolidation. There is a fairly small focal hiatal hernia. There is a small pericardial effusion. Hepatobiliary: There is evidence of a degree of hepatic steatosis. No focal liver lesions are evident. Gallbladder wall is not appreciably thickened. There is no biliary duct dilatation. Common bile duct appears upper normal without mass or calculus evident. Pancreas: No pancreatic mass or inflammatory focus. Spleen: No splenic lesions are evident. Adrenals/Urinary Tract: Adrenals appear unremarkable bilaterally. There is moderate renal sinus fat bilaterally. There is no appreciable renal mass or hydronephrosis on either side. There is no renal or ureteral calculus on either side. Urinary bladder is midline with wall thickness within normal limits. Stomach/Bowel: There is extensive  sigmoid diverticulosis. There is focal diverticulitis with wall thickening and mesenteric thickening at the junction of the descending colon and sigmoid colon in the left abdominal- pelvic junction. There is no abscess in this area. On axial slice 68 series 3, a tiny focus of microperforation is noted. Elsewhere, there is no bowel wall or mesenteric thickening. There are multiple diverticula scattered throughout the remainder of the colon, particularly on the right, without diverticulitis elsewhere. No bowel obstruction. No free air beyond the minimal free air in the micro perforation site in the sigmoid colon. No portal venous air. Vascular/Lymphatic: There is atherosclerotic calcification in the aorta and common iliac arteries. No aneurysm evident. Major mesenteric vessels are patent, although there are foci of atherosclerotic calcification in proximal mesenteric arteries. There is no appreciable adenopathy in the abdomen or pelvis. Reproductive: Uterus is anteverted.  No evident pelvic mass. Other: Appendix appears normal. No abscess or ascites evident in the abdomen or pelvis. Musculoskeletal: There are foci of degenerative change in the lumbar spine. There is spinal stenosis at L4-5 and L5-S1 due to diffuse disc protrusion as well as bony hypertrophy. There are no evident blastic or lytic bone lesions. There is no intramuscular or abdominal wall lesion. IMPRESSION: 1. Diverticulitis focally at the junction of the descending colon and sigmoid colon. No abscess. There is a tiny focus of microperforation in this area, however. 2. Multiple colonic diverticula elsewhere without diverticulitis elsewhere. No bowel obstruction. No abscess elsewhere in the abdomen or pelvis. Appendix appears normal. 3.  No renal or ureteral calculus.  No hydronephrosis. 4.  Fairly small hiatal hernia. 5.  Small pericardial effusion. 6.  Aortoiliac atherosclerosis. 7.  Spinal stenosis at L4-5 and L5-S1, multifactorial. Aortic  Atherosclerosis (ICD10-I70.0). These results were called by telephone at the time of interpretation on 09/05/2016 at 5:47 pm to Dr. Crista Curb , who verbally acknowledged these results. Electronically Signed   By: Bretta Bang III M.D.   On: 09/05/2016 17:47        Scheduled Meds: . amLODipine  5 mg Oral Daily  . atenolol  50 mg Oral Daily  . Difluprednate  1 drop Left Eye QID  . heparin subcutaneous  5,000 Units Subcutaneous Q8H  . insulin aspart  0-15 Units Subcutaneous TID WC  . insulin aspart  0-5 Units Subcutaneous QHS  . ketorolac  1 drop Left Eye QHS  . levothyroxine  137 mcg Oral QAC breakfast  . LORazepam  1 mg Oral BID  . moxifloxacin  1 drop Left Eye QID  . sertraline  50 mg Oral Daily  . traZODone  50 mg Oral QHS   Continuous Infusions: . sodium chloride 100 mL/hr at 09/05/16 2146  . piperacillin-tazobactam (ZOSYN)  IV 3.375 g (09/06/16 1037)     LOS: 0 days    Time spent: 35 min    Zakiah Gauthreaux U Arabela Basaldua, DO [facility] Pager [phone]  If 7PM-7AM, please contact night-coverage [email or web address] Password [email or web address] 09/06/2016, 11:17 AM

## 2016-09-06 NOTE — Progress Notes (Signed)
Patient arrived to 6n08 alert and oriented, no pain, IV fluids infusing, skin intact. Oriented patient to room and staff, pt stated family on the way.

## 2016-09-06 NOTE — ED Notes (Signed)
Pt upset b/c she was moved from pod A to pod F. Pt states she would rather have a room upstairs. This nurse tried to explain the situation to pt, but pt was still verbally upset. Pt requested to speak to charge nurse.

## 2016-09-06 NOTE — Consult Note (Signed)
Reason for Consult:  Diverticulitis Referring Physician: [name] is an 83 y.o. female.  HPI: 83 y/o female presents with 2 day history fo abdominal pain, and diarrhea.  5/10 pain.  She has a hx of SOB with exertion, worse now with abdominal pain.  Hx of prior Diverticulitis.  Pain is a little better with ibuprofen.   Work up in the ED on 8/19: she is afebrile, VSS.  Labs show the creatinine is up to 1.42, glucose 120, CMP is otherwise normal.  WBC 8.2, UA shows 6-30 WBC. CXR OK; cardiomegaly without pulmonary edema.  CT scan: some hepatic steatosis, extensive sigmoid diverticulosis with focal diverticulitis and mesenteric thickening at the junction of the descending colon and the sigmoid, no abscess, microperforation noted.  We are ask to see.  Labs this AM: Creatinine is better 1.34;  WBC 6.6, H/H down some with hydration to 11/35.  TSH 4.9.  Past Medical History:  Diagnosis Date  . Alopecia (capitis) totalis   . Chronic headaches   . Chronic renal disease, stage 3, moderately decreased glomerular filtration rate between 30-59 mL/min/1.73 square meter   . Degenerative joint disease   . Depression   . Diabetes mellitus due to underlying condition with diabetic chronic kidney disease ([place])   . Diverticulosis   . Hematuria, microscopic   . Hyperlipidemia   . Hypertension   . Hypothyroidism   . Osteopenia   . Pneumonia   . Thrombocytopenia ([place])   . Vitamin D deficiency     Past Surgical History:  Procedure Laterality Date  . CATARACT EXTRACTION, BILATERAL  6/18, 8/18    Family History  Problem Relation Age of Onset  . Uterine cancer Mother   . Bladder Cancer Father   . Dementia Sister     Social History:  reports that she has never smoked. She has never used smokeless tobacco. She reports that she does not drink alcohol or use drugs.  Allergies:  Allergies  Allergen Reactions  . Crestor [Rosuvastatin] Other (See Comments)    Muscle cramps  . Lisinopril  Other (See Comments)    Decreased GFR.  Marland Kitchen Pravachol [Pravastatin Sodium] Other (See Comments)    Leg cramps.  . Simvastatin Other (See Comments)    Leg cramps  . Tolectin [Tolmetin] Swelling    Medications:  Prior to Admission:  (Not in a hospital admission) Scheduled: . amLODipine  5 mg Oral Daily  . atenolol  50 mg Oral Daily  . Difluprednate  1 drop Left Eye QID  . gatifloxacin  1 drop Left Eye QID  . insulin aspart  0-15 Units Subcutaneous TID WC  . insulin aspart  0-5 Units Subcutaneous QHS  . ketorolac  1 drop Left Eye QHS  . levothyroxine  137 mcg Oral QAC breakfast  . LORazepam  1 mg Oral BID  . sertraline  50 mg Oral Daily  . traZODone  50 mg Oral QHS   Continuous: . sodium chloride 100 mL/hr at 09/05/16 2146  . piperacillin-tazobactam (ZOSYN)  IV Stopped (09/06/16 0540)   Anti-infectives    Start     Dose/Rate Route Frequency Ordered Stop   09/06/16 0100  piperacillin-tazobactam (ZOSYN) IVPB 3.375 g     3.375 g 12.5 mL/hr over 240 Minutes Intravenous Every 8 hours 09/05/16 1955     09/05/16 1800  piperacillin-tazobactam (ZOSYN) IVPB 3.375 g     3.375 g 100 mL/hr over 30 Minutes Intravenous  Once 09/05/16 1751 09/05/16 1900  Results for orders placed or performed during the hospital encounter of 09/05/16 (from the past 48 hour(s))  Lipase, blood     Status: None   Collection Time: 09/05/16  2:16 PM  Result Value Ref Range   Lipase 21 11 - 51 U/L  Comprehensive metabolic panel     Status: Abnormal   Collection Time: 09/05/16  2:16 PM  Result Value Ref Range   Sodium 136 135 - 145 mmol/L   Potassium 3.9 3.5 - 5.1 mmol/L   Chloride 101 101 - 111 mmol/L   CO2 27 22 - 32 mmol/L   Glucose, Bld 120 (H) 65 - 99 mg/dL   BUN 20 6 - 20 mg/dL   Creatinine, Ser 1.42 (H) 0.44 - 1.00 mg/dL   Calcium 9.3 8.9 - 10.3 mg/dL   Total Protein 7.6 6.5 - 8.1 g/dL   Albumin 3.6 3.5 - 5.0 g/dL   AST 29 15 - 41 U/L   ALT 28 14 - 54 U/L   Alkaline Phosphatase 92 38 - 126  U/L   Total Bilirubin 0.5 0.3 - 1.2 mg/dL   GFR calc non Af Amer 32 (L) >60 mL/min   GFR calc Af Amer 37 (L) >60 mL/min    Comment: (NOTE) The eGFR has been calculated using the CKD EPI equation. This calculation has not been validated in all clinical situations. eGFR's persistently <60 mL/min signify possible Chronic Kidney Disease.    Anion gap 8 5 - 15  CBC     Status: None   Collection Time: 09/05/16  2:16 PM  Result Value Ref Range   WBC 8.2 4.0 - 10.5 K/uL   RBC 4.76 3.87 - 5.11 MIL/uL   Hemoglobin 13.1 12.0 - 15.0 g/dL   HCT 39.8 36.0 - 46.0 %   MCV 83.6 78.0 - 100.0 fL   MCH 27.5 26.0 - 34.0 pg   MCHC 32.9 30.0 - 36.0 g/dL   RDW 14.0 11.5 - 15.5 %   Platelets  150 - 400 K/uL    PLATELET CLUMPS NOTED ON SMEAR, COUNT APPEARS ADEQUATE  I-Stat Troponin, ED (not at [facility])     Status: None   Collection Time: 09/05/16  4:32 PM  Result Value Ref Range   Troponin i, poc 0.00 0.00 - 0.08 ng/mL   Comment 3            Comment: Due to the release kinetics of cTnI, a negative result within the first hours of the onset of symptoms does not rule out myocardial infarction with certainty. If myocardial infarction is still suspected, repeat the test at appropriate intervals.   Urinalysis, Routine w reflex microscopic     Status: Abnormal   Collection Time: 09/05/16  7:50 PM  Result Value Ref Range   Color, Urine STRAW (A) YELLOW   APPearance CLEAR CLEAR   Specific Gravity, Urine 1.027 1.005 - 1.030   pH 5.0 5.0 - 8.0   Glucose, UA NEGATIVE NEGATIVE mg/dL   Hgb urine dipstick SMALL (A) NEGATIVE   Bilirubin Urine NEGATIVE NEGATIVE   Ketones, ur NEGATIVE NEGATIVE mg/dL   Protein, ur NEGATIVE NEGATIVE mg/dL   Nitrite NEGATIVE NEGATIVE   Leukocytes, UA TRACE (A) NEGATIVE   RBC / HPF 0-5 0 - 5 RBC/hpf   WBC, UA 6-30 0 - 5 WBC/hpf   Bacteria, UA NONE SEEN NONE SEEN   Squamous Epithelial / LPF 0-5 (A) NONE SEEN   Mucous PRESENT   CBG monitoring, ED  Status: Abnormal   Collection  Time: 09/05/16  9:38 PM  Result Value Ref Range   Glucose-Capillary 108 (H) 65 - 99 mg/dL  TSH     Status: Abnormal   Collection Time: 09/05/16 10:00 PM  Result Value Ref Range   TSH 4.907 (H) 0.350 - 4.500 uIU/mL    Comment: Performed by a 3rd Generation assay with a functional sensitivity of <=0.01 uIU/mL.  Basic metabolic panel     Status: Abnormal   Collection Time: 09/06/16  6:14 AM  Result Value Ref Range   Sodium 137 135 - 145 mmol/L   Potassium 3.7 3.5 - 5.1 mmol/L   Chloride 105 101 - 111 mmol/L   CO2 26 22 - 32 mmol/L   Glucose, Bld 126 (H) 65 - 99 mg/dL   BUN 17 6 - 20 mg/dL   Creatinine, Ser 1.34 (H) 0.44 - 1.00 mg/dL   Calcium 7.9 (L) 8.9 - 10.3 mg/dL   GFR calc non Af Amer 34 (L) >60 mL/min   GFR calc Af Amer 40 (L) >60 mL/min    Comment: (NOTE) The eGFR has been calculated using the CKD EPI equation. This calculation has not been validated in all clinical situations. eGFR's persistently <60 mL/min signify possible Chronic Kidney Disease.    Anion gap 6 5 - 15  CBC     Status: Abnormal (Preliminary result)   Collection Time: 09/06/16  6:14 AM  Result Value Ref Range   WBC 6.6 4.0 - 10.5 K/uL   RBC 4.09 3.87 - 5.11 MIL/uL   Hemoglobin 11.1 (L) 12.0 - 15.0 g/dL   HCT 35.0 (L) 36.0 - 46.0 %   MCV 85.6 78.0 - 100.0 fL   MCH 27.1 26.0 - 34.0 pg   MCHC 31.7 30.0 - 36.0 g/dL   RDW 14.5 11.5 - 15.5 %   Platelets PENDING 150 - 400 K/uL    Dg Chest 2 View  Result Date: 09/05/2016 CLINICAL DATA:  Left lower quadrant abdominal pain and dyspnea EXAM: CHEST  2 VIEW COMPARISON:  12/14/2010 chest radiograph. FINDINGS: Stable borderline mild cardiomegaly. Aortic atherosclerosis. Otherwise normal mediastinal contour. No pneumothorax. No pleural effusion. No overt pulmonary edema. No acute consolidative airspace disease. IMPRESSION: Stable borderline mild cardiomegaly without overt pulmonary edema. No active pulmonary disease. Electronically Signed   By: Ilona Sorrel M.D.   On:  09/05/2016 17:50   Ct Abdomen Pelvis W Contrast  Result Date: 09/05/2016 CLINICAL DATA:  Left-sided abdominal pain and nausea EXAM: CT ABDOMEN AND PELVIS WITH CONTRAST TECHNIQUE: Multidetector CT imaging of the abdomen and pelvis was performed using the standard protocol following bolus administration of intravenous contrast. CONTRAST:  54m ISOVUE-300 IOPAMIDOL (ISOVUE-300) INJECTION 61% COMPARISON:  August 23, 2012. FINDINGS: Lower chest: There is atelectatic change in the left lung base. There is no lung base edema or consolidation. There is a fairly small focal hiatal hernia. There is a small pericardial effusion. Hepatobiliary: There is evidence of a degree of hepatic steatosis. No focal liver lesions are evident. Gallbladder wall is not appreciably thickened. There is no biliary duct dilatation. Common bile duct appears upper normal without mass or calculus evident. Pancreas: No pancreatic mass or inflammatory focus. Spleen: No splenic lesions are evident. Adrenals/Urinary Tract: Adrenals appear unremarkable bilaterally. There is moderate renal sinus fat bilaterally. There is no appreciable renal mass or hydronephrosis on either side. There is no renal or ureteral calculus on either side. Urinary bladder is midline with wall thickness within normal limits. Stomach/Bowel: There  is extensive sigmoid diverticulosis. There is focal diverticulitis with wall thickening and mesenteric thickening at the junction of the descending colon and sigmoid colon in the left abdominal- pelvic junction. There is no abscess in this area. On axial slice 68 series 3, a tiny focus of microperforation is noted. Elsewhere, there is no bowel wall or mesenteric thickening. There are multiple diverticula scattered throughout the remainder of the colon, particularly on the right, without diverticulitis elsewhere. No bowel obstruction. No free air beyond the minimal free air in the micro perforation site in the sigmoid colon. No portal  venous air. Vascular/Lymphatic: There is atherosclerotic calcification in the aorta and common iliac arteries. No aneurysm evident. Major mesenteric vessels are patent, although there are foci of atherosclerotic calcification in proximal mesenteric arteries. There is no appreciable adenopathy in the abdomen or pelvis. Reproductive: Uterus is anteverted.  No evident pelvic mass. Other: Appendix appears normal. No abscess or ascites evident in the abdomen or pelvis. Musculoskeletal: There are foci of degenerative change in the lumbar spine. There is spinal stenosis at L4-5 and L5-S1 due to diffuse disc protrusion as well as bony hypertrophy. There are no evident blastic or lytic bone lesions. There is no intramuscular or abdominal wall lesion. IMPRESSION: 1. Diverticulitis focally at the junction of the descending colon and sigmoid colon. No abscess. There is a tiny focus of microperforation in this area, however. 2. Multiple colonic diverticula elsewhere without diverticulitis elsewhere. No bowel obstruction. No abscess elsewhere in the abdomen or pelvis. Appendix appears normal. 3.  No renal or ureteral calculus.  No hydronephrosis. 4.  Fairly small hiatal hernia. 5.  Small pericardial effusion. 6.  Aortoiliac atherosclerosis. 7.  Spinal stenosis at L4-5 and L5-S1, multifactorial. Aortic Atherosclerosis (ICD10-I70.0). These results were called by telephone at the time of interpretation on 09/05/2016 at 5:47 pm to Dr. Brantley Stage , who verbally acknowledged these results. Electronically Signed   By: Lowella Grip III M.D.   On: 09/05/2016 17:47    Review of Systems  Constitutional: Negative.   HENT: Positive for congestion (mostly at night when she tries to sleep) and hearing loss (she says she needs hearing aids).   Eyes: Positive for photophobia and pain. Negative for blurred vision, double vision, discharge and redness.       Cataract surgery last Tuesday left eye, still wears the dark glasses with light   Respiratory: Positive for shortness of breath (DOE, worse with abdominal pain.). Negative for cough, hemoptysis, sputum production and wheezing.   Cardiovascular: Negative.   Gastrointestinal: Positive for abdominal pain (mostly in RLQ), diarrhea (2 days ago with onset of sx.  None last 24-36 hours) and heartburn (worse lying down, keeps her head up some at night). Negative for blood in stool, constipation, melena, nausea and vomiting.       Colonoscopy about 2008 per Dr. Howell Rucks.  Prior hx of diverticulitis on the right around 2007 as best she can remember.  Genitourinary: Positive for urgency.  Musculoskeletal: Negative.   Skin: Negative.   Neurological: Negative.  Negative for headaches (Hx of migraines resolved with menopause).  Endo/Heme/Allergies: Negative.   Psychiatric/Behavioral: Positive for depression. The patient is nervous/anxious.    Blood pressure (!) 119/49, pulse (!) 57, temperature (!) 97.4 F (36.3 C), temperature source Oral, resp. rate 16, height '5\' 4"'$  (1.626 m), weight 82.6 kg (182 lb), SpO2 96 %. Physical Exam  Constitutional: She is oriented to person, place, and time. She appears well-developed and well-nourished. No distress.  Body  mass index is 31.24 kg/m.   HENT:  Head: Normocephalic and atraumatic.  Nose: Nose normal.  Mouth/Throat: No oropharyngeal exudate.  Eyes: Right eye exhibits no discharge. Left eye exhibits no discharge. No scleral icterus.  Pupils are equal  Neck: Normal range of motion. Neck supple. No JVD present. No tracheal deviation present. No thyromegaly present.  Cardiovascular: Normal rate, regular rhythm, normal heart sounds and intact distal pulses.   No murmur heard. Respiratory: Breath sounds normal. No respiratory distress. She has no wheezes. She has no rales.  GI: Soft. Bowel sounds are normal. She exhibits no distension and no mass. There is tenderness (LLQ, primary site, but also sore in the Right lower quad.). There is no  rebound and no guarding.  Musculoskeletal: She exhibits no edema or tenderness.  Lymphadenopathy:    She has no cervical adenopathy.  Neurological: She is alert and oriented to person, place, and time. No cranial nerve deficit.  WEars sun glasses with the light, still a little photophobia.  Left cataract surgery last week.  Skin: Skin is warm and dry. No rash noted. She is not diaphoretic. No erythema. No pallor.  Psychiatric: She has a normal mood and affect. Her behavior is normal. Judgment and thought content normal.    Assessment/Plan: Diverticulitis with microperforation/Hx of diverticulitis on right 2007 Type II diabetes Hypertension Hypothyroid Hyperlipidemia Stage III renal disease Cataract surgery 08/31/16 FEN:  NPO except for some sips and ice chips for oral comfort/IV fluids ID:  Zosyn 8/19 =>> day 2 DVT: SCD   Plan:  I would keep her NPO for now except for ice chips and sips of clears.  Continue bowel rest and antibiotics.  Hopefully this will resolve with just antibiotic treatment.  We will follow with you.  She can have DVT prophylaxis Rx from our standpoint.    Kelly Barker 09/06/2016, 7:31 AM

## 2016-09-06 NOTE — ED Notes (Signed)
Pt very upset after being moved to another pod for holding purposes. Denyse Amass, RN explained why patient was being moved, however pt still remained upset. This RN also explained why patient was moved and that she would be holding until admission bed was available. Pt expressed feeling that she felt afraid and alone; re-ensured pt that there was staff available to care for her; offered pt hospital bed, in which she declined. She also requested her son be contacted; I spoke with pt's son, Maurine Minister, who offered to come sit with patient if needed. Marcie Bal, RN made primary nurse, pt provided comfort measures

## 2016-09-06 NOTE — Progress Notes (Signed)
Admission nurse notified of new patient.  

## 2016-09-06 NOTE — ED Notes (Signed)
Attempted report 

## 2016-09-07 LAB — GLUCOSE, CAPILLARY
GLUCOSE-CAPILLARY: 105 mg/dL — AB (ref 65–99)
GLUCOSE-CAPILLARY: 98 mg/dL (ref 65–99)
Glucose-Capillary: 141 mg/dL — ABNORMAL HIGH (ref 65–99)
Glucose-Capillary: 144 mg/dL — ABNORMAL HIGH (ref 65–99)
Glucose-Capillary: 87 mg/dL (ref 65–99)
Glucose-Capillary: 93 mg/dL (ref 65–99)
Glucose-Capillary: 94 mg/dL (ref 65–99)

## 2016-09-07 LAB — CBC
HEMATOCRIT: 33.5 % — AB (ref 36.0–46.0)
Hemoglobin: 10.8 g/dL — ABNORMAL LOW (ref 12.0–15.0)
MCH: 27.1 pg (ref 26.0–34.0)
MCHC: 32.2 g/dL (ref 30.0–36.0)
MCV: 84.2 fL (ref 78.0–100.0)
PLATELETS: 139 10*3/uL — AB (ref 150–400)
RBC: 3.98 MIL/uL (ref 3.87–5.11)
RDW: 14.1 % (ref 11.5–15.5)
WBC: 5.7 10*3/uL (ref 4.0–10.5)

## 2016-09-07 LAB — BASIC METABOLIC PANEL
ANION GAP: 7 (ref 5–15)
BUN: 10 mg/dL (ref 6–20)
CALCIUM: 8.1 mg/dL — AB (ref 8.9–10.3)
CO2: 24 mmol/L (ref 22–32)
CREATININE: 1.19 mg/dL — AB (ref 0.44–1.00)
Chloride: 107 mmol/L (ref 101–111)
GFR calc Af Amer: 46 mL/min — ABNORMAL LOW (ref >60)
GFR calc non Af Amer: 40 mL/min — ABNORMAL LOW (ref >60)
GLUCOSE: 95 mg/dL (ref 65–99)
Potassium: 3.7 mmol/L (ref 3.5–5.1)
Sodium: 138 mmol/L (ref 135–145)

## 2016-09-07 LAB — CALCIUM, IONIZED: CALCIUM, IONIZED, SERUM: 4.5 mg/dL (ref 4.5–5.6)

## 2016-09-07 NOTE — Progress Notes (Signed)
    CC:  Abdominal pain  Subjective: She is doing better, still has some discomfort with the LLQ and a little on the RLQ.  + BS.  Didn't walk much yesterday.    Objective: Vital signs in last 24 hours: Temp:  [98.2 F (36.8 C)-98.5 F (36.9 C)] 98.2 F (36.8 C) (08/21 0407) Pulse Rate:  [59-65] 65 (08/21 0407) Resp:  [16] 16 (08/21 0407) BP: (139-146)/(48-49) 139/48 (08/21 0407) SpO2:  [91 %-95 %] 95 % (08/21 0407) Last BM Date: 09/05/16 3223 iv Voided x 2 recorded Afebrile, VSS Creatinine is better down to 1.19, WBC remains normal  No films Intake/Output from previous day: 08/20 0701 - 08/21 0700 In: 3223.3 [I.V.:3223.3] Out: -  Intake/Output this shift: No intake/output data recorded.  General appearance: alert, cooperative and no distress Resp: clear to auscultation bilaterally GI: soft, still a little tender LLQ >RLQ, but much better.  + BS  Lab Results:   Recent Labs  09/06/16 0614 09/07/16 0253  WBC 6.6 5.7  HGB 11.1* 10.8*  HCT 35.0* 33.5*  PLT PLATELET CLUMPING, SUGGEST RECOLLECTION OF SAMPLE IN CITRATE TUBE. 139*    BMET  Recent Labs  09/06/16 0614 09/07/16 0253  NA 137 138  K 3.7 3.7  CL 105 107  CO2 26 24  GLUCOSE 126* 95  BUN 17 10  CREATININE 1.34* 1.19*  CALCIUM 7.9* 8.1*   PT/INR No results for input(s): LABPROT, INR in the last 72 hours.   Recent Labs Lab 09/05/16 1416  AST 29  ALT 28  ALKPHOS 92  BILITOT 0.5  PROT 7.6  ALBUMIN 3.6     Lipase     Component Value Date/Time   LIPASE 21 09/05/2016 1416     Medications: . amLODipine  5 mg Oral Daily  . atenolol  50 mg Oral Daily  . Difluprednate  1 drop Left Eye QID  . heparin subcutaneous  5,000 Units Subcutaneous Q8H  . insulin aspart  0-9 Units Subcutaneous Q4H  . ketorolac  1 drop Left Eye QHS  . levothyroxine  137 mcg Oral QAC breakfast  . LORazepam  1 mg Oral BID  . moxifloxacin  1 drop Left Eye QID  . sertraline  50 mg Oral Daily  . traZODone  50 mg Oral  QHS   . sodium chloride 100 mL/hr at 09/07/16 0148  . piperacillin-tazobactam (ZOSYN)  IV 3.375 g (09/07/16 0802)    Assessment/Plan Diverticulitis with microperforation/Hx of diverticulitis on right 2007 Type II diabetes Hypertension Hypothyroid Hyperlipidemia Stage III renal disease Cataract surgery 08/31/16 FEN:  NPO except for some sips and ice chips for oral comfort/IV fluids ID:  Zosyn 8/19 =>> day 3 DVT: SCD/heparin    Plan:  Clears, and mobilize more.  Recheck labs tomorrow.  Hopefully we can move her along quickly to OP Rx soon.     LOS: 1 day    Kelly Barker 09/07/2016 [phone]

## 2016-09-07 NOTE — Progress Notes (Signed)
PROGRESS NOTE    Kelly Barker  ZOX:[id number] DOB: 08/17/1925 DOA: 09/05/2016 PCP: Merlene Laughter, MD   Outpatient Specialists:     Brief Narrative:  Kelly Barker is a 83 y.o. female with medical history significant of DM; HTN; HLD; hypothyroidism; CKD stage 3; and depression/anxiety presenting with abdominal pain in the LLQ that started over the weekend and got worse this AM.  The pain started Friday afternoon - she ate some tomoatoes for lunch and she had severe diarrhea (one episode) that afternoon.  Abdominal pain started thereafter.  It has been aching throughout, but today it was worse, maybe 5/10 all day today.  Some nausea, no vomiting.  No further diarrhea since Friday.  She did have a normal BM on Saturday.  No fever.     Assessment & Plan:   Principal Problem:   Diverticulitis Active Problems:   Essential hypertension   Diabetes mellitus type 2 in obese (HCC)   Hypothyroidism   CKD (chronic kidney disease) stage 3, GFR 30-59 ml/min   History of left cataract surgery   Diverticulitis -Patient's symptoms are c/w diverticulitis and her CT supports this as a diagnosis -CT does show an area of microperforation -tolerating clear diet -General surgery following: suspect patient can be d/c'd tomm with PO abx  HTN -Continue Norvasc and Atenolol -Hold Diazide due to CKD; will most likely stop  Anemia of CD -outpatient follow up -no sign of bleeding -suspect some dilution  DM -Hold Metformin -Cover with moderate-scale SSI for now  CKD stage III -Prior value was 1.24 but this was in 2011 -Not appreciably different at this time so doubt AKI component -follow BMP  Hypothyroidism -TSH: 4.907 -outpatient follow up -Continue Synthroid at current dose for now  Recent left cataract surgery -Continue drops as prescribed    DVT prophylaxis:  SQ Heparin  Code Status: DNR   Family Communication: None at bedside  Disposition Plan:  Possibly home in  the AM   Consultants:  General surgery  Subjective: Feeling much better, able to tolerate diet, no nausea, pain improved   Objective: Vitals:   09/06/16 0847 09/06/16 1430 09/06/16 2008 09/07/16 0407  BP: (!) 136/48 (!) 146/48 (!) 141/49 (!) 139/48  Pulse: 63 (!) 59 61 65  Resp:  16 16 16   Temp: 98 F (36.7 C) 98.5 F (36.9 C) 98.3 F (36.8 C) 98.2 F (36.8 C)  TempSrc:  Oral Oral Oral  SpO2: 92% 91% 94% 95%  Weight:      Height:        Intake/Output Summary (Last 24 hours) at 09/07/16 1248 Last data filed at 09/07/16 1137  Gross per 24 hour  Intake          3523.33 ml  Output                0 ml  Net          3523.33 ml   Filed Weights   09/05/16 1402  Weight: 82.6 kg (182 lb)    Examination:  General exam: up walking hallways Respiratory system: clear Cardiovascular system: rrr Gastrointestinal system: mainly tender in LLQ Central nervous system: alert+ Ox3 Extremities: moves all 4 ext. Skin: No rashes, lesions or ulcers     Data Reviewed: I have personally reviewed following labs and imaging studies  CBC:  Recent Labs Lab 09/05/16 1416 09/06/16 0614 09/07/16 0253  WBC 8.2 6.6 5.7  HGB 13.1 11.1* 10.8*  HCT 39.8 35.0* 33.5*  MCV 83.6  85.6 84.2  PLT PLATELET CLUMPS NOTED ON SMEAR, COUNT APPEARS ADEQUATE PLATELET CLUMPING, SUGGEST RECOLLECTION OF SAMPLE IN CITRATE TUBE. 139*   Basic Metabolic Panel:  Recent Labs Lab 09/05/16 1416 09/06/16 0614 09/07/16 0253  NA 136 137 138  K 3.9 3.7 3.7  CL 101 105 107  CO2 27 26 24   GLUCOSE 120* 126* 95  BUN 20 17 10   CREATININE 1.42* 1.34* 1.19*  CALCIUM 9.3 7.9* 8.1*   GFR: Estimated Creatinine Clearance: 34 mL/min (A) (by C-G formula based on SCr of 1.19 mg/dL (H)). Liver Function Tests:  Recent Labs Lab 09/05/16 1416  AST 29  ALT 28  ALKPHOS 92  BILITOT 0.5  PROT 7.6  ALBUMIN 3.6    Recent Labs Lab 09/05/16 1416  LIPASE 21   No results for input(s): AMMONIA in the last 168  hours. Coagulation Profile: No results for input(s): INR, PROTIME in the last 168 hours. Cardiac Enzymes: No results for input(s): CKTOTAL, CKMB, CKMBINDEX, TROPONINI in the last 168 hours. BNP (last 3 results) No results for input(s): PROBNP in the last 8760 hours. HbA1C: No results for input(s): HGBA1C in the last 72 hours. CBG:  Recent Labs Lab 09/06/16 2010 09/07/16 0036 09/07/16 0408 09/07/16 0731 09/07/16 1144  GLUCAP 91 87 94 98 144*   Lipid Profile: No results for input(s): CHOL, HDL, LDLCALC, TRIG, CHOLHDL, LDLDIRECT in the last 72 hours. Thyroid Function Tests:  Recent Labs  09/05/16 2200  TSH 4.907*   Anemia Panel: No results for input(s): VITAMINB12, FOLATE, FERRITIN, TIBC, IRON, RETICCTPCT in the last 72 hours. Urine analysis:    Component Value Date/Time   COLORURINE STRAW (A) 09/05/2016 1950   APPEARANCEUR CLEAR 09/05/2016 1950   LABSPEC 1.027 09/05/2016 1950   PHURINE 5.0 09/05/2016 1950   GLUCOSEU NEGATIVE 09/05/2016 1950   HGBUR SMALL (A) 09/05/2016 1950   BILIRUBINUR NEGATIVE 09/05/2016 1950   KETONESUR NEGATIVE 09/05/2016 1950   PROTEINUR NEGATIVE 09/05/2016 1950   UROBILINOGEN 0.2 11/28/2009 1233   NITRITE NEGATIVE 09/05/2016 1950   LEUKOCYTESUR TRACE (A) 09/05/2016 1950     )No results found for this or any previous visit (from the past 240 hour(s)).    Anti-infectives    Start     Dose/Rate Route Frequency Ordered Stop   09/06/16 0100  piperacillin-tazobactam (ZOSYN) IVPB 3.375 g     3.375 g 12.5 mL/hr over 240 Minutes Intravenous Every 8 hours 09/05/16 1955     09/05/16 1800  piperacillin-tazobactam (ZOSYN) IVPB 3.375 g     3.375 g 100 mL/hr over 30 Minutes Intravenous  Once 09/05/16 1751 09/05/16 1900       Radiology Studies: Dg Chest 2 View  Result Date: 09/05/2016 CLINICAL DATA:  Left lower quadrant abdominal pain and dyspnea EXAM: CHEST  2 VIEW COMPARISON:  12/14/2010 chest radiograph. FINDINGS: Stable borderline mild  cardiomegaly. Aortic atherosclerosis. Otherwise normal mediastinal contour. No pneumothorax. No pleural effusion. No overt pulmonary edema. No acute consolidative airspace disease. IMPRESSION: Stable borderline mild cardiomegaly without overt pulmonary edema. No active pulmonary disease. Electronically Signed   By: Delbert Phenix M.D.   On: 09/05/2016 17:50   Ct Abdomen Pelvis W Contrast  Result Date: 09/05/2016 CLINICAL DATA:  Left-sided abdominal pain and nausea EXAM: CT ABDOMEN AND PELVIS WITH CONTRAST TECHNIQUE: Multidetector CT imaging of the abdomen and pelvis was performed using the standard protocol following bolus administration of intravenous contrast. CONTRAST:  72mL ISOVUE-300 IOPAMIDOL (ISOVUE-300) INJECTION 61% COMPARISON:  August 23, 2012. FINDINGS: Lower chest: There  is atelectatic change in the left lung base. There is no lung base edema or consolidation. There is a fairly small focal hiatal hernia. There is a small pericardial effusion. Hepatobiliary: There is evidence of a degree of hepatic steatosis. No focal liver lesions are evident. Gallbladder wall is not appreciably thickened. There is no biliary duct dilatation. Common bile duct appears upper normal without mass or calculus evident. Pancreas: No pancreatic mass or inflammatory focus. Spleen: No splenic lesions are evident. Adrenals/Urinary Tract: Adrenals appear unremarkable bilaterally. There is moderate renal sinus fat bilaterally. There is no appreciable renal mass or hydronephrosis on either side. There is no renal or ureteral calculus on either side. Urinary bladder is midline with wall thickness within normal limits. Stomach/Bowel: There is extensive sigmoid diverticulosis. There is focal diverticulitis with wall thickening and mesenteric thickening at the junction of the descending colon and sigmoid colon in the left abdominal- pelvic junction. There is no abscess in this area. On axial slice 68 series 3, a tiny focus of  microperforation is noted. Elsewhere, there is no bowel wall or mesenteric thickening. There are multiple diverticula scattered throughout the remainder of the colon, particularly on the right, without diverticulitis elsewhere. No bowel obstruction. No free air beyond the minimal free air in the micro perforation site in the sigmoid colon. No portal venous air. Vascular/Lymphatic: There is atherosclerotic calcification in the aorta and common iliac arteries. No aneurysm evident. Major mesenteric vessels are patent, although there are foci of atherosclerotic calcification in proximal mesenteric arteries. There is no appreciable adenopathy in the abdomen or pelvis. Reproductive: Uterus is anteverted.  No evident pelvic mass. Other: Appendix appears normal. No abscess or ascites evident in the abdomen or pelvis. Musculoskeletal: There are foci of degenerative change in the lumbar spine. There is spinal stenosis at L4-5 and L5-S1 due to diffuse disc protrusion as well as bony hypertrophy. There are no evident blastic or lytic bone lesions. There is no intramuscular or abdominal wall lesion. IMPRESSION: 1. Diverticulitis focally at the junction of the descending colon and sigmoid colon. No abscess. There is a tiny focus of microperforation in this area, however. 2. Multiple colonic diverticula elsewhere without diverticulitis elsewhere. No bowel obstruction. No abscess elsewhere in the abdomen or pelvis. Appendix appears normal. 3.  No renal or ureteral calculus.  No hydronephrosis. 4.  Fairly small hiatal hernia. 5.  Small pericardial effusion. 6.  Aortoiliac atherosclerosis. 7.  Spinal stenosis at L4-5 and L5-S1, multifactorial. Aortic Atherosclerosis (ICD10-I70.0). These results were called by telephone at the time of interpretation on 09/05/2016 at 5:47 pm to Dr. Crista Curb , who verbally acknowledged these results. Electronically Signed   By: Bretta Bang III M.D.   On: 09/05/2016 17:47        Scheduled  Meds: . amLODipine  5 mg Oral Daily  . atenolol  50 mg Oral Daily  . Difluprednate  1 drop Left Eye QID  . heparin subcutaneous  5,000 Units Subcutaneous Q8H  . insulin aspart  0-9 Units Subcutaneous Q4H  . ketorolac  1 drop Left Eye QHS  . levothyroxine  137 mcg Oral QAC breakfast  . LORazepam  1 mg Oral BID  . moxifloxacin  1 drop Left Eye QID  . sertraline  50 mg Oral Daily  . traZODone  50 mg Oral QHS   Continuous Infusions: . sodium chloride 75 mL/hr at 09/07/16 1107  . piperacillin-tazobactam (ZOSYN)  IV Stopped (09/07/16 1202)     LOS: 1 day  Time spent: 25 min    Darlys Buis Juanetta Gosling, DO [facility] Pager [phone]  If 7PM-7AM, please contact night-coverage [email or web address] Password [facility] 09/07/2016, 12:48 PM

## 2016-09-08 DIAGNOSIS — E039 Hypothyroidism, unspecified: Secondary | ICD-10-CM

## 2016-09-08 LAB — BASIC METABOLIC PANEL
Anion gap: 6 (ref 5–15)
BUN: 5 mg/dL — ABNORMAL LOW (ref 6–20)
CHLORIDE: 109 mmol/L (ref 101–111)
CO2: 25 mmol/L (ref 22–32)
CREATININE: 1.06 mg/dL — AB (ref 0.44–1.00)
Calcium: 8.3 mg/dL — ABNORMAL LOW (ref 8.9–10.3)
GFR, EST AFRICAN AMERICAN: 53 mL/min — AB (ref >60)
GFR, EST NON AFRICAN AMERICAN: 45 mL/min — AB (ref >60)
Glucose, Bld: 108 mg/dL — ABNORMAL HIGH (ref 65–99)
Potassium: 3.5 mmol/L (ref 3.5–5.1)
SODIUM: 140 mmol/L (ref 135–145)

## 2016-09-08 LAB — CBC
HCT: 32.9 % — ABNORMAL LOW (ref 36.0–46.0)
HEMOGLOBIN: 10.4 g/dL — AB (ref 12.0–15.0)
MCH: 26.4 pg (ref 26.0–34.0)
MCHC: 31.6 g/dL (ref 30.0–36.0)
MCV: 83.5 fL (ref 78.0–100.0)
PLATELETS: 141 10*3/uL — AB (ref 150–400)
RBC: 3.94 MIL/uL (ref 3.87–5.11)
RDW: 13.9 % (ref 11.5–15.5)
WBC: 4.3 10*3/uL (ref 4.0–10.5)

## 2016-09-08 LAB — GLUCOSE, CAPILLARY
GLUCOSE-CAPILLARY: 102 mg/dL — AB (ref 65–99)
GLUCOSE-CAPILLARY: 124 mg/dL — AB (ref 65–99)
Glucose-Capillary: 117 mg/dL — ABNORMAL HIGH (ref 65–99)

## 2016-09-08 MED ORDER — SACCHAROMYCES BOULARDII 250 MG PO CAPS
250.0000 mg | ORAL_CAPSULE | Freq: Two times a day (BID) | ORAL | Status: DC
  Administered 2016-09-08: 250 mg via ORAL
  Filled 2016-09-08: qty 1

## 2016-09-08 MED ORDER — AMOXICILLIN-POT CLAVULANATE 875-125 MG PO TABS: 1.0000 | ORAL_TABLET | Freq: Two times a day (BID) | ORAL | 0 refills | Status: DC

## 2016-09-08 MED ORDER — SACCHAROMYCES BOULARDII 250 MG PO CAPS: 250.0000 mg | ORAL_CAPSULE | Freq: Two times a day (BID) | ORAL | 0 refills | Status: DC

## 2016-09-08 MED ORDER — AMOXICILLIN-POT CLAVULANATE 875-125 MG PO TABS
1.0000 | ORAL_TABLET | Freq: Two times a day (BID) | ORAL | Status: DC
  Administered 2016-09-08: 1 via ORAL
  Filled 2016-09-08: qty 1

## 2016-09-08 NOTE — Progress Notes (Signed)
Discharge to home ambulatory, PIV removed no signs of swelling and infiltration noted. D/c instructions and follow up appointments done,discussed with patient,verbalized understanding.

## 2016-09-08 NOTE — Progress Notes (Signed)
    CC:  Abdominal pain  Subjective: She is doing well, minimal tenderness, tolerating diet.  Does not like pancakes.  Did well with bacon and eggs.    Objective: Vital signs in last 24 hours: Temp:  [97.8 F (36.6 C)-98.3 F (36.8 C)] 98.3 F (36.8 C) (08/22 0427) Pulse Rate:  [60-76] 76 (08/22 0427) Resp:  [17-18] 18 (08/22 0427) BP: (154-177)/(48-54) 154/51 (08/22 0427) SpO2:  [93 %-97 %] 93 % (08/22 0427) Last BM Date: 09/07/16 360 Po recorded 1650 IV Voided x 6 BM x 2 Afebrile, VSS Labs OK WBC is normal again Intake/Output from previous day: 08/21 0701 - 08/22 0700 In: 2010 [P.O.:360; I.V.:1650] Out: -  Intake/Output this shift: No intake/output data recorded.  General appearance: alert, cooperative and no distress GI: soft, sore, but not tender.  + BS, still having loose stools.  Lab Results:   Recent Labs  09/07/16 0253 09/08/16 0414  WBC 5.7 4.3  HGB 10.8* 10.4*  HCT 33.5* 32.9*  PLT 139* 141*    BMET  Recent Labs  09/07/16 0253 09/08/16 0414  NA 138 140  K 3.7 3.5  CL 107 109  CO2 24 25  GLUCOSE 95 108*  BUN 10 5*  CREATININE 1.19* 1.06*  CALCIUM 8.1* 8.3*   PT/INR No results for input(s): LABPROT, INR in the last 72 hours.   Recent Labs Lab 09/05/16 1416  AST 29  ALT 28  ALKPHOS 92  BILITOT 0.5  PROT 7.6  ALBUMIN 3.6     Lipase     Component Value Date/Time   LIPASE 21 09/05/2016 1416     Medications: . amLODipine  5 mg Oral Daily  . atenolol  50 mg Oral Daily  . Difluprednate  1 drop Left Eye QID  . heparin subcutaneous  5,000 Units Subcutaneous Q8H  . insulin aspart  0-9 Units Subcutaneous Q4H  . ketorolac  1 drop Left Eye QHS  . levothyroxine  137 mcg Oral QAC breakfast  . LORazepam  1 mg Oral BID  . moxifloxacin  1 drop Left Eye QID  . sertraline  50 mg Oral Daily  . traZODone  50 mg Oral QHS   . sodium chloride 75 mL/hr at 09/07/16 2246  . piperacillin-tazobactam (ZOSYN)  IV Stopped (09/08/16 0541)     Assessment/Plan Diverticulitis with microperforation/Hx of diverticulitis on right 2007 Type II diabetes Hypertension Hypothyroid Hyperlipidemia Stage III renal disease Cataract surgery 08/31/16 FEN: Carb Modified/ IV fluids ID: [id number] 8/19 =>>day 4 DVT: SCD/heparin    Plan:  Add probiotic, home on oral Augmentin, follow up with Dr. Pete Glatter her primary care.  I will put our information in just so she can remember who she saw here.   LOS: 2 days    Kelly Barker 09/08/2016 [phone]

## 2016-09-08 NOTE — Discharge Summary (Signed)
Physician Discharge Summary  Kelly Barker QMV:[id number] DOB: 29-Apr-1925 DOA: 09/05/2016  PCP: Merlene Laughter, MD  Admit date: 09/05/2016 Discharge date: 09/08/2016   Recommendations for Outpatient Follow-Up:   1. 10 more days of PO augmentin 2. Consider discontinuation of diazide 3. TSH 6 weeks   Discharge Diagnosis:   Principal Problem:   Diverticulitis Active Problems:   Essential hypertension   Diabetes mellitus type 2 in obese (HCC)   Hypothyroidism   CKD (chronic kidney disease) stage 3, GFR 30-59 ml/min   History of left cataract surgery   Discharge disposition:  Home. :  Discharge Condition: Improved.  Diet recommendation: low residue  Wound care: None.   History of Present Illness:   Kelly Barker is a 83 y.o. female with medical history significant of DM; HTN; HLD; hypothyroidism; CKD stage 3; and depression/anxiety presenting with abdominal pain in the LLQ that started over the weekend and got worse this AM.  The pain started Friday afternoon - she ate some tomoatoes for lunch and she had severe diarrhea (one episode) that afternoon.  Abdominal pain started thereafter.  It has been aching throughout, but today it was worse, maybe 5/10 all day today.  Some nausea, no vomiting.  No further diarrhea since Friday.  She did have a normal BM on Saturday.  No fever.    She had cataract surgery on Tuesday of last week on the left; she had the right eye done last June   Hospital Course by Problem:   Diverticulitis -Patient's symptoms are c/w diverticulitis and her CT supports this as a diagnosis improving -low fiber diet and PO abx for 10 more days -outpatient follow up  HTN -Continue Norvasc and Atenolol -consider stopping diazide  DM -resume home meds  CKD stage III -outpatient follow up  Hypothyroidism TSH 4.9-- outpatient follow up  Recent left cataract surgery -Continue drops as prescribed     Medical Consultants:     GS   Discharge Exam:   Vitals:   09/07/16 2007 09/08/16 0427  BP: (!) 159/54 (!) 154/51  Pulse: 63 76  Resp: 17 18  Temp: 97.8 F (36.6 C) 98.3 F (36.8 C)  SpO2: 93% 93%   Vitals:   09/07/16 0407 09/07/16 1407 09/07/16 2007 09/08/16 0427  BP: (!) 139/48 (!) 177/48 (!) 159/54 (!) 154/51  Pulse: 65 60 63 76  Resp: 16 17 17 18   Temp: 98.2 F (36.8 C) 98.1 F (36.7 C) 97.8 F (36.6 C) 98.3 F (36.8 C)  TempSrc: Oral Oral Oral Oral  SpO2: 95% 97% 93% 93%  Weight:      Height:        Gen:  NAD- eating well    The results of significant diagnostics from this hospitalization (including imaging, microbiology, ancillary and laboratory) are listed below for reference.     Procedures and Diagnostic Studies:   Dg Chest 2 View  Result Date: 09/05/2016 CLINICAL DATA:  Left lower quadrant abdominal pain and dyspnea EXAM: CHEST  2 VIEW COMPARISON:  12/14/2010 chest radiograph. FINDINGS: Stable borderline mild cardiomegaly. Aortic atherosclerosis. Otherwise normal mediastinal contour. No pneumothorax. No pleural effusion. No overt pulmonary edema. No acute consolidative airspace disease. IMPRESSION: Stable borderline mild cardiomegaly without overt pulmonary edema. No active pulmonary disease. Electronically Signed   By: Delbert Phenix M.D.   On: 09/05/2016 17:50   Ct Abdomen Pelvis W Contrast  Result Date: 09/05/2016 CLINICAL DATA:  Left-sided abdominal pain and nausea EXAM: CT ABDOMEN AND PELVIS WITH CONTRAST  TECHNIQUE: Multidetector CT imaging of the abdomen and pelvis was performed using the standard protocol following bolus administration of intravenous contrast. CONTRAST:  80mL ISOVUE-300 IOPAMIDOL (ISOVUE-300) INJECTION 61% COMPARISON:  August 23, 2012. FINDINGS: Lower chest: There is atelectatic change in the left lung base. There is no lung base edema or consolidation. There is a fairly small focal hiatal hernia. There is a small pericardial effusion. Hepatobiliary: There is  evidence of a degree of hepatic steatosis. No focal liver lesions are evident. Gallbladder wall is not appreciably thickened. There is no biliary duct dilatation. Common bile duct appears upper normal without mass or calculus evident. Pancreas: No pancreatic mass or inflammatory focus. Spleen: No splenic lesions are evident. Adrenals/Urinary Tract: Adrenals appear unremarkable bilaterally. There is moderate renal sinus fat bilaterally. There is no appreciable renal mass or hydronephrosis on either side. There is no renal or ureteral calculus on either side. Urinary bladder is midline with wall thickness within normal limits. Stomach/Bowel: There is extensive sigmoid diverticulosis. There is focal diverticulitis with wall thickening and mesenteric thickening at the junction of the descending colon and sigmoid colon in the left abdominal- pelvic junction. There is no abscess in this area. On axial slice 68 series 3, a tiny focus of microperforation is noted. Elsewhere, there is no bowel wall or mesenteric thickening. There are multiple diverticula scattered throughout the remainder of the colon, particularly on the right, without diverticulitis elsewhere. No bowel obstruction. No free air beyond the minimal free air in the micro perforation site in the sigmoid colon. No portal venous air. Vascular/Lymphatic: There is atherosclerotic calcification in the aorta and common iliac arteries. No aneurysm evident. Major mesenteric vessels are patent, although there are foci of atherosclerotic calcification in proximal mesenteric arteries. There is no appreciable adenopathy in the abdomen or pelvis. Reproductive: Uterus is anteverted.  No evident pelvic mass. Other: Appendix appears normal. No abscess or ascites evident in the abdomen or pelvis. Musculoskeletal: There are foci of degenerative change in the lumbar spine. There is spinal stenosis at L4-5 and L5-S1 due to diffuse disc protrusion as well as bony hypertrophy. There  are no evident blastic or lytic bone lesions. There is no intramuscular or abdominal wall lesion. IMPRESSION: 1. Diverticulitis focally at the junction of the descending colon and sigmoid colon. No abscess. There is a tiny focus of microperforation in this area, however. 2. Multiple colonic diverticula elsewhere without diverticulitis elsewhere. No bowel obstruction. No abscess elsewhere in the abdomen or pelvis. Appendix appears normal. 3.  No renal or ureteral calculus.  No hydronephrosis. 4.  Fairly small hiatal hernia. 5.  Small pericardial effusion. 6.  Aortoiliac atherosclerosis. 7.  Spinal stenosis at L4-5 and L5-S1, multifactorial. Aortic Atherosclerosis (ICD10-I70.0). These results were called by telephone at the time of interpretation on 09/05/2016 at 5:47 pm to Dr. Crista Curb , who verbally acknowledged these results. Electronically Signed   By: Bretta Bang III M.D.   On: 09/05/2016 17:47     Labs:   Basic Metabolic Panel:  Recent Labs Lab 09/05/16 1416 09/06/16 0614 09/07/16 0253 09/08/16 0414  NA 136 137 138 140  K 3.9 3.7 3.7 3.5  CL 101 105 107 109  CO2 27 26 24 25   GLUCOSE 120* 126* 95 108*  BUN 20 17 10  5*  CREATININE 1.42* 1.34* 1.19* 1.06*  CALCIUM 9.3 7.9* 8.1* 8.3*   GFR Estimated Creatinine Clearance: 38.2 mL/min (A) (by C-G formula based on SCr of 1.06 mg/dL (H)). Liver Function Tests:  Recent  Labs Lab 09/05/16 1416  AST 29  ALT 28  ALKPHOS 92  BILITOT 0.5  PROT 7.6  ALBUMIN 3.6    Recent Labs Lab 09/05/16 1416  LIPASE 21   No results for input(s): AMMONIA in the last 168 hours. Coagulation profile No results for input(s): INR, PROTIME in the last 168 hours.  CBC:  Recent Labs Lab 09/05/16 1416 09/06/16 0614 09/07/16 0253 09/08/16 0414  WBC 8.2 6.6 5.7 4.3  HGB 13.1 11.1* 10.8* 10.4*  HCT 39.8 35.0* 33.5* 32.9*  MCV 83.6 85.6 84.2 83.5  PLT PLATELET CLUMPS NOTED ON SMEAR, COUNT APPEARS ADEQUATE PLATELET CLUMPING, SUGGEST  RECOLLECTION OF SAMPLE IN CITRATE TUBE. 139* 141*   Cardiac Enzymes: No results for input(s): CKTOTAL, CKMB, CKMBINDEX, TROPONINI in the last 168 hours. BNP: Invalid input(s): POCBNP CBG:  Recent Labs Lab 09/07/16 1602 09/07/16 2004 09/07/16 2351 09/08/16 0423 09/08/16 0802  GLUCAP 141* 93 105* 102* 124*   D-Dimer No results for input(s): DDIMER in the last 72 hours. Hgb A1c No results for input(s): HGBA1C in the last 72 hours. Lipid Profile No results for input(s): CHOL, HDL, LDLCALC, TRIG, CHOLHDL, LDLDIRECT in the last 72 hours. Thyroid function studies  Recent Labs  09/05/16 2200  TSH 4.907*   Anemia work up No results for input(s): VITAMINB12, FOLATE, FERRITIN, TIBC, IRON, RETICCTPCT in the last 72 hours. Microbiology No results found for this or any previous visit (from the past 240 hour(s)).   Discharge Instructions:   Discharge Instructions    Discharge instructions    Complete by:  As directed    Low fiber diet   Increase activity slowly    Complete by:  As directed      Allergies as of 09/08/2016      Reactions   Crestor [rosuvastatin] Other (See Comments)   Muscle cramps   Lisinopril Other (See Comments)   Decreased GFR.   Pravachol [pravastatin Sodium] Other (See Comments)   Leg cramps.   Simvastatin Other (See Comments)   Leg cramps   Tolectin [tolmetin] Swelling      Medication List    TAKE these medications   acetaminophen 325 MG tablet Commonly known as:  TYLENOL Take 325 mg by mouth every 6 (six) hours as needed for mild pain.   amLODipine 5 MG tablet Commonly known as:  NORVASC Take 5 mg by mouth daily.   amoxicillin-clavulanate 875-125 MG tablet Commonly known as:  AUGMENTIN Take 1 tablet by mouth every 12 (twelve) hours.   DUREZOL 0.05 % Emul Generic drug:  Difluprednate Place 1 drop into the left eye 4 (four) times daily.   ketorolac 0.4 % Soln Commonly known as:  ACULAR Place 1 drop into the left eye at bedtime.    levothyroxine 137 MCG tablet Commonly known as:  SYNTHROID, LEVOTHROID Take 137 mcg by mouth daily before breakfast.   loperamide 2 MG capsule Commonly known as:  IMODIUM Take 2 mg by mouth as needed for diarrhea or loose stools.   LORazepam 1 MG tablet Commonly known as:  ATIVAN Take 1 mg by mouth 2 (two) times daily. Take one tablet twice daily.   metFORMIN 500 MG 24 hr tablet Commonly known as:  GLUCOPHAGE-XR Take 500 mg by mouth 2 (two) times daily with a meal.   moxifloxacin 0.5 % ophthalmic solution Commonly known as:  VIGAMOX Place 1 drop into the left eye 4 (four) times daily.   saccharomyces boulardii 250 MG capsule Commonly known as:  FLORASTOR Take 1  capsule (250 mg total) by mouth 2 (two) times daily.   sertraline 50 MG tablet Commonly known as:  ZOLOFT Take 50 mg by mouth daily.   TENORMIN 50 MG tablet Generic drug:  atenolol Take 50 mg by mouth daily.   traZODone 50 MG tablet Commonly known as:  DESYREL Take 50 mg by mouth at bedtime.   triamterene-hydrochlorothiazide 37.5-25 MG capsule Commonly known as:  DYAZIDE Take 1 capsule by mouth every morning.            Discharge Care Instructions        Start     Ordered   09/08/16 0000  amoxicillin-clavulanate (AUGMENTIN) 875-125 MG tablet  Every 12 hours     09/08/16 1046   09/08/16 0000  saccharomyces boulardii (FLORASTOR) 250 MG capsule  2 times daily    Comments:  Can take OTC supplement as well in-place of this   09/08/16 1046   09/08/16 0000  Increase activity slowly     09/08/16 1046   09/08/16 0000  Discharge instructions    Comments:  Low fiber diet   09/08/16 1046     Follow-up Information    [facility] Follow up.   Specialty:  General Surgery Why:  You can follow up with Dr. Pete Glatter, you do not need to see our service.  Dr. Dwain Sarna followed your from our office while you were here.  We recommend follow up with colonoscopy in 6 weeks.   Contact  information: [address] [place] [address] [phone]        Merlene Laughter, MD Follow up.   Specialty:  Internal Medicine Why:  CAll and make an appointment for 1-2 week from discharge. Contact information: [address] [place] [address] [phone]            Time coordinating discharge: 35 min  Signed:  Seniah Lawrence Juanetta Gosling   [facility] 09/08/2016, 10:47 AM

## 2016-09-08 NOTE — Discharge Instructions (Signed)

## 2017-11-10 ENCOUNTER — Emergency Department (HOSPITAL_COMMUNITY): Payer: Medicare Other

## 2017-11-10 ENCOUNTER — Other Ambulatory Visit: Payer: Self-pay

## 2017-11-10 ENCOUNTER — Emergency Department (HOSPITAL_COMMUNITY)
Admission: EM | Admit: 2017-11-10 | Discharge: 2017-11-10 | Disposition: A | Discharge status: Home / Self Care | Payer: Medicare Other | Attending: Emergency Medicine | Admitting: Emergency Medicine

## 2017-11-10 ENCOUNTER — Encounter (HOSPITAL_COMMUNITY): Payer: Self-pay | Admitting: Emergency Medicine

## 2017-11-10 DIAGNOSIS — K5792 Diverticulitis of intestine, part unspecified, without perforation or abscess without bleeding: Secondary | ICD-10-CM | POA: Insufficient documentation

## 2017-11-10 DIAGNOSIS — Z7984 Long term (current) use of oral hypoglycemic drugs: Secondary | ICD-10-CM | POA: Insufficient documentation

## 2017-11-10 DIAGNOSIS — N183 Chronic kidney disease, stage 3 (moderate): Secondary | ICD-10-CM | POA: Diagnosis not present

## 2017-11-10 DIAGNOSIS — R109 Unspecified abdominal pain: Secondary | ICD-10-CM | POA: Diagnosis present

## 2017-11-10 DIAGNOSIS — I129 Hypertensive chronic kidney disease with stage 1 through stage 4 chronic kidney disease, or unspecified chronic kidney disease: Secondary | ICD-10-CM | POA: Insufficient documentation

## 2017-11-10 DIAGNOSIS — E1122 Type 2 diabetes mellitus with diabetic chronic kidney disease: Secondary | ICD-10-CM | POA: Diagnosis not present

## 2017-11-10 DIAGNOSIS — Z79899 Other long term (current) drug therapy: Secondary | ICD-10-CM | POA: Diagnosis not present

## 2017-11-10 LAB — LIPASE, BLOOD: Lipase: 26 U/L (ref 11–51)

## 2017-11-10 LAB — COMPREHENSIVE METABOLIC PANEL
ALBUMIN: 3.7 g/dL (ref 3.5–5.0)
ALK PHOS: 97 U/L (ref 38–126)
ALT: 16 U/L (ref 0–44)
ANION GAP: 9 (ref 5–15)
AST: 15 U/L (ref 15–41)
BILIRUBIN TOTAL: 0.8 mg/dL (ref 0.3–1.2)
BUN: 19 mg/dL (ref 8–23)
CALCIUM: 9.7 mg/dL (ref 8.9–10.3)
CO2: 26 mmol/L (ref 22–32)
Chloride: 102 mmol/L (ref 98–111)
Creatinine, Ser: 1.38 mg/dL — ABNORMAL HIGH (ref 0.44–1.00)
GFR calc Af Amer: 38 mL/min — ABNORMAL LOW (ref >60)
GFR calc non Af Amer: 33 mL/min — ABNORMAL LOW (ref >60)
GLUCOSE: 173 mg/dL — AB (ref 70–99)
Potassium: 3.5 mmol/L (ref 3.5–5.1)
Sodium: 137 mmol/L (ref 135–145)
TOTAL PROTEIN: 7.1 g/dL (ref 6.5–8.1)

## 2017-11-10 LAB — URINALYSIS, ROUTINE W REFLEX MICROSCOPIC
BILIRUBIN URINE: NEGATIVE
Glucose, UA: NEGATIVE mg/dL
Ketones, ur: NEGATIVE mg/dL
Nitrite: NEGATIVE
Protein, ur: NEGATIVE mg/dL
Specific Gravity, Urine: 1.03 (ref 1.005–1.030)
pH: 5 (ref 5.0–8.0)

## 2017-11-10 LAB — CBC
HCT: 40.6 % (ref 36.0–46.0)
Hemoglobin: 12.9 g/dL (ref 12.0–15.0)
MCH: 27 pg (ref 26.0–34.0)
MCHC: 31.8 g/dL (ref 30.0–36.0)
MCV: 84.9 fL (ref 80.0–100.0)
PLATELETS: UNDETERMINED 10*3/uL (ref 150–400)
RBC: 4.78 MIL/uL (ref 3.87–5.11)
RDW: 13.6 % (ref 11.5–15.5)
WBC: 9 10*3/uL (ref 4.0–10.5)
nRBC: 0 % (ref 0.0–0.2)

## 2017-11-10 MED ORDER — SODIUM CHLORIDE 0.9 % IV BOLUS
500.0000 mL | Freq: Once | INTRAVENOUS | Status: AC
  Administered 2017-11-10: 500 mL via INTRAVENOUS

## 2017-11-10 MED ORDER — OXYCODONE HCL 5 MG PO TABS: 5.0000 mg | ORAL_TABLET | Freq: Two times a day (BID) | ORAL | 0 refills | Status: DC | PRN

## 2017-11-10 MED ORDER — AMOXICILLIN-POT CLAVULANATE 875-125 MG PO TABS: 1.0000 | ORAL_TABLET | Freq: Two times a day (BID) | ORAL | 0 refills | Status: AC

## 2017-11-10 MED ORDER — IOHEXOL 300 MG/ML  SOLN
100.0000 mL | Freq: Once | INTRAMUSCULAR | Status: AC | PRN
  Administered 2017-11-10: 100 mL via INTRAVENOUS

## 2017-11-10 MED ORDER — MORPHINE SULFATE (PF) 4 MG/ML IV SOLN
4.0000 mg | Freq: Once | INTRAVENOUS | Status: AC
  Administered 2017-11-10: 4 mg via INTRAVENOUS
  Filled 2017-11-10: qty 1

## 2017-11-10 MED ORDER — AMOXICILLIN-POT CLAVULANATE 875-125 MG PO TABS
1.0000 | ORAL_TABLET | Freq: Once | ORAL | Status: AC
  Administered 2017-11-10: 1 via ORAL
  Filled 2017-11-10: qty 1

## 2017-11-10 NOTE — ED Notes (Signed)
Patient verbalizes understanding of discharge instructions. Opportunity for questioning and answers were provided. Pt discharged from ED. 

## 2017-11-10 NOTE — ED Provider Notes (Signed)
[facility] EMERGENCY DEPARTMENT Provider Note   CSN: [id number] Arrival date & time: 11/10/17  0907     History   Chief Complaint Chief Complaint  Patient presents with  . Abdominal Pain  . Diverticulitis    HPI Kelly Barker is a 84 y.o. female with history of CKD, DM, diverticulosis, HLD, HTN, hypothyroidism presents for evaluation of acute onset, worsening abdominal pain for 3 days.  She states that symptoms began on Tuesday evening.  Pain is constant, aching, occasionally sharp, worst in the left lower quadrant of the abdomen but radiates around the lower abdomen.  States pain is consistent with prior diverticulitis flares but she was "hoping to treated at home and not have to come".  Denies fevers, chest pain, nausea, vomiting.  Notes ongoing problems with urinary retention which her PCP has been working up but denies any dysuria, hematuria, urgency, or frequency.  Has had some loose stools but denies diarrhea, constipation, melena, or hematochezia.  Denies suspicious food intake.  The history is provided by the patient.    Past Medical History:  Diagnosis Date  . Alopecia (capitis) totalis   . Chronic headaches   . Chronic renal disease, stage 3, moderately decreased glomerular filtration rate between 30-59 mL/min/1.73 square meter (HCC)   . Degenerative joint disease   . Depression   . Diabetes mellitus due to underlying condition with diabetic chronic kidney disease (HCC)   . Diverticulitis 08/2016  . Diverticulosis   . Hematuria, microscopic   . Hyperlipidemia   . Hypertension   . Hypothyroidism   . Osteopenia   . Pneumonia   . Thrombocytopenia (HCC)   . Vitamin D deficiency     Patient Active Problem List   Diagnosis Date Noted  . Diverticulitis 09/05/2016  . Essential hypertension 09/05/2016  . Diabetes mellitus type 2 in obese (HCC) 09/05/2016  . Hypothyroidism 09/05/2016  . CKD (chronic kidney disease) stage 3, GFR 30-59 ml/min (HCC)  09/05/2016  . History of left cataract surgery 09/05/2016    Past Surgical History:  Procedure Laterality Date  . CATARACT EXTRACTION, BILATERAL  6/18, 8/18     OB History   None      Home Medications    Prior to Admission medications   Medication Sig Start Date End Date Taking? Authorizing Provider  acetaminophen (TYLENOL) 325 MG tablet Take 325 mg by mouth every 6 (six) hours as needed for mild pain.    [provider]  amLODipine (NORVASC) 5 MG tablet Take 5 mg by mouth daily.    [provider]  amoxicillin-clavulanate (AUGMENTIN) 875-125 MG tablet Take 1 tablet by mouth every 12 (twelve) hours. 09/08/16   Joseph Art, DO  atenolol (TENORMIN) 50 MG tablet Take 50 mg by mouth daily.    [provider]  DUREZOL 0.05 % EMUL Place 1 drop into the left eye 4 (four) times daily. 08/27/16   [provider]  ketorolac (ACULAR) 0.4 % SOLN Place 1 drop into the left eye at bedtime.  08/27/16   [provider]  levothyroxine (SYNTHROID, LEVOTHROID) 137 MCG tablet Take 137 mcg by mouth daily before breakfast.     [provider]  loperamide (IMODIUM) 2 MG capsule Take 2 mg by mouth as needed for diarrhea or loose stools.    [provider]  LORazepam (ATIVAN) 1 MG tablet Take 1 mg by mouth 2 (two) times daily. Take one tablet twice daily.    [provider]  metFORMIN (GLUCOPHAGE-XR) 500 MG 24 hr tablet Take 500 mg by mouth 2 (two) times daily with a meal.    [provider]  moxifloxacin (VIGAMOX) 0.5 % ophthalmic solution Place 1 drop into the left eye 4 (four) times daily. 08/27/16   [provider]  saccharomyces boulardii (FLORASTOR) 250 MG capsule Take 1 capsule (250 mg total) by mouth 2 (two) times daily. 09/08/16   Joseph Art, DO  sertraline (ZOLOFT) 50 MG tablet Take 50 mg by mouth daily.    [provider]  traZODone (DESYREL) 50 MG tablet Take 50 mg by mouth at bedtime.     [provider]  triamterene-hydrochlorothiazide (DYAZIDE) 37.5-25 MG per capsule Take 1 capsule by mouth every morning.    [provider]    Family History Family History  Problem Relation Age of Onset  . Uterine cancer Mother   . Bladder Cancer Father   . Dementia Sister     Social History Social History   Tobacco Use  . Smoking status: Never Smoker  . Smokeless tobacco: Never Used  Substance Use Topics  . Alcohol use: No  . Drug use: No     Allergies   Crestor [rosuvastatin]; Lisinopril; Pravachol [pravastatin sodium]; Simvastatin; and Tolectin [tolmetin]   Review of Systems Review of Systems  Constitutional: Negative for chills and fever.  Respiratory: Negative for shortness of breath.   Cardiovascular: Negative for chest pain.  Gastrointestinal: Positive for abdominal pain. Negative for constipation, diarrhea, nausea and vomiting.  Genitourinary: Positive for difficulty urinating. Negative for dysuria and hematuria.  All other systems reviewed and are negative.    Physical Exam Updated Vital Signs BP 131/61   Pulse 61   Temp 98.1 F (36.7 C) (Oral)   Resp 18   Wt 82.6 kg   SpO2 95%   BMI 31.26 kg/m   Physical Exam  Constitutional: She appears well-developed and well-nourished. No distress.  HENT:  Head: Normocephalic and atraumatic.  Eyes: Conjunctivae are normal. Right eye exhibits no discharge. Left eye exhibits no discharge.  Neck: No JVD present. No tracheal deviation present.  Cardiovascular: Normal rate and intact distal pulses.  Pulmonary/Chest: Effort normal and breath sounds normal.  Abdominal: Soft. She exhibits no distension. There is tenderness in the right lower quadrant, periumbilical area, suprapubic area, left upper quadrant and left lower quadrant. There is guarding. There is no rigidity, no rebound, no CVA tenderness, no tenderness at McBurney's point and negative Murphy's sign.  Maximally tender to palpation in the  left upper quadrant  Musculoskeletal: She exhibits no edema.  No midline spine TTP, no paraspinal muscle tenderness, no deformity, crepitus, or step-off noted   Neurological: She is alert.  Skin: Skin is warm and dry. No erythema.  Psychiatric: She has a normal mood and affect. Her behavior is normal.  Nursing note and vitals reviewed.    ED Treatments / Results  Labs (all labs ordered are listed, but only abnormal results are displayed) Labs Reviewed  COMPREHENSIVE METABOLIC PANEL - Abnormal; Notable for the following components:      Result Value   Glucose, Bld 173 (*)    Creatinine, Ser 1.38 (*)    GFR calc non Af Amer 33 (*)    GFR calc Af Amer 38 (*)    All other components within normal limits  LIPASE, BLOOD  CBC  URINALYSIS, ROUTINE W REFLEX MICROSCOPIC    EKG EKG Interpretation  Date/Time:  Thursday November 10 2017 14:05:43 EDT Ventricular Rate:  51 PR Interval:  246 QRS Duration: 96 QT Interval:  442 QTC Calculation: 407 R Axis:   58 Text Interpretation:  Sinus bradycardia with 1st degree A-V block with Blocked Premature atrial complexes Otherwise normal ECG No STEMI.  Confirmed by Alona Bene [id number]) on 11/10/2017 2:14:24 PM   Radiology Ct Abdomen Pelvis W Contrast  Result Date: 11/10/2017 CLINICAL DATA:  Left lower quadrant pain, history of diverticulitis EXAM: CT ABDOMEN AND PELVIS WITH CONTRAST TECHNIQUE: Multidetector CT imaging of the abdomen and pelvis was performed using the standard protocol following bolus administration of intravenous contrast. CONTRAST:  OMNIPAQUE IOHEXOL 300 MG/ML  SOLN COMPARISON:  09/05/2016 FINDINGS: Lower chest: No acute abnormality. Hepatobiliary: No focal liver abnormality is seen. No gallstones, gallbladder wall thickening, or biliary dilatation. Pancreas: Unremarkable. No pancreatic ductal dilatation or surrounding inflammatory changes. Spleen: Normal in size without focal abnormality. Adrenals/Urinary Tract: Adrenal  glands are unremarkable. Kidneys are normal, without renal calculi, focal lesion, or hydronephrosis. Bladder is unremarkable. Stomach/Bowel: There are diverticular changes of the colon identified. Focal diverticulitis at the junction of the descending and sigmoid colons is noted without evidence of perforation or abscess formation. The remainder of the colon shows no significant inflammatory change. No small bowel abnormality is noted. The stomach is within normal limits with the exception of a small sliding-type hiatal hernia. Vascular/Lymphatic: Aortic atherosclerosis. No enlarged abdominal or pelvic lymph nodes. Reproductive: Uterus and bilateral adnexa are unremarkable. Other: No abdominal wall hernia or abnormality. No abdominopelvic ascites. Musculoskeletal: Degenerative changes of lumbar spine are noted. IMPRESSION: Diverticulitis at the junction of the descending and sigmoid colons. No abscess or perforation is seen. Electronically Signed   By: Alcide Clever M.D.   On: 11/10/2017 15:24    Procedures Procedures (including critical care time)  Medications Ordered in ED Medications  amoxicillin-clavulanate (AUGMENTIN) 875-125 MG per tablet 1 tablet (has no administration in time range)  morphine 4 MG/ML injection 4 mg (4 mg Intravenous Given 11/10/17 1221)  sodium chloride 0.9 % bolus 500 mL (0 mLs Intravenous Stopped 11/10/17 1340)  iohexol (OMNIPAQUE) 300 MG/ML solution 100 mL (100 mLs Intravenous Contrast Given 11/10/17 1458)     Initial Impression / Assessment and Plan / ED Course  I have reviewed the triage vital signs and the nursing notes.  Pertinent labs & imaging results that were available during my care of the patient were reviewed by me and considered in my medical decision making (see chart for details).     Patient with left lower quadrant abdominal pain for 3 days.  States it feels similar to prior diverticulitis flares.  She is afebrile, vital signs are stable.  She is  nontoxic in appearance.  Lab work reviewed by me shows no leukocytosis, mildly elevated creatinine.  Lipase and LFTs within normal limits.  Will obtain CT scan to rule out acute surgical abdominal pathology and further assess.  Awaiting urine sample for UA.  3:52 PM CT significant for acute diverticulitis with no abscess or perforation noted.  No evidence of acute surgical abdominal pathology including obstruction, perforation, appendicitis, cholecystitis, TOA, ovarian torsion, or dissection. Patient's pain has been controlled in the ED and she is tolerating p.o. fluids without difficulty.  She would like to go home which I think is reasonable given she appears quite well and has good access to PCP follow-up.  First dose Augmentin given in the ED.  Awaiting UA.  4:04 PM Signed out to oncoming provider PA Lawyer.  Awaiting UA.  Will culture  if consistent with UTI.  Still reasonable to treat with Augmentin monotherapy.  She understands to follow-up with her PCP within 48 hours for reevaluation.  Will discharge with a small amount of oxycodone as needed for severe pain.  Discussed appropriate use of this medication and its side effects.  Advised to avoid taking ibuprofen "as needed "and instead to take it when she has pain only.  We discussed strict ED return precautions.  Patient was seen and evaluated by Dr. Jacqulyn Bath who agrees with assessment and plan at this time.  Patient and patient's son verbalized understanding of and agreement with plan.  Anticipate discharge home.  Final Clinical Impressions(s) / ED Diagnoses   Final diagnoses:  Acute diverticulitis    ED Discharge Orders    None       Jeanie Sewer, PA-C 11/10/17 1604    Maia Plan, MD 11/10/17 [phone]

## 2017-11-10 NOTE — Discharge Instructions (Signed)
Please take all of your antibiotics until finished!   You may develop abdominal discomfort or diarrhea from the antibiotic.  You may help offset this with probiotics which you can buy or get in yogurt. Do not eat  or take the probiotics until 2 hours after your antibiotic.   Take [phone] mg of Tylenol every 6 hours as needed for pain. Do not exceed 4000 mg of Tylenol daily.  You can take 400 mg of ibuprofen occasionally as needed for pain.  Do not take this medicine if you are not having any pain.  You can take oxycodone as needed for severe pain but do not drive, drink alcohol, operate heavy machinery, or make important decisions while taking this medicine as it may make you drowsy.  You can also cut these tablets in half if they are too strong.  Follow-up with your primary care physician in the next few days for reevaluation and discussion of your diagnosis today.  Return to the emergency department immediately for any concerning signs or symptoms develop such as fever, worsening pain, persistent vomiting, or if your abdomen becomes very rigid.

## 2017-11-10 NOTE — ED Triage Notes (Signed)
Hx of diverticulitis - c/o left sided abd pain- ate slaw on Monday- thinks that may be what caused it. Loose stools, but no diarrhea

## 2017-11-10 NOTE — ED Notes (Signed)
Lab called: Platelets reading is an error. Platelets are clumped and too low to count. See correction.

## 2017-11-10 NOTE — ED Notes (Signed)
Pt tolerating PO fluids

## 2017-11-21 ENCOUNTER — Other Ambulatory Visit: Payer: Self-pay | Admitting: Urology

## 2017-11-22 ENCOUNTER — Encounter (HOSPITAL_BASED_OUTPATIENT_CLINIC_OR_DEPARTMENT_OTHER): Payer: Self-pay | Admitting: *Deleted

## 2017-11-22 ENCOUNTER — Other Ambulatory Visit: Payer: Self-pay

## 2017-11-22 NOTE — Progress Notes (Signed)
Spoke with Kelly Barker after midnight arrive 1015 am 11-29-17 [facility] meds to take sip of water: tylenol prn, amlodipine, atenolol, levothyroxine, lorazepam, sertraline,  Records on chart/epic ekg 11-10-17, cbc, bmet, lipase, ua 11-10-17 Driver daughter in Social worker or grand daughter will stay for surgery Has surgery orders in epic No labs needed

## 2017-11-23 NOTE — Progress Notes (Signed)
Pt called via phone to add to her medication list.  List updated with two antibiotics pt started yesterday evening.

## 2017-11-29 ENCOUNTER — Other Ambulatory Visit: Payer: Self-pay

## 2017-11-29 ENCOUNTER — Ambulatory Visit (HOSPITAL_BASED_OUTPATIENT_CLINIC_OR_DEPARTMENT_OTHER)
Admission: RE | Admit: 2017-11-29 | Discharge: 2017-11-29 | Disposition: A | Discharge status: Home / Self Care | Payer: Medicare Other | Source: Ambulatory Visit | Attending: Urology | Admitting: Urology

## 2017-11-29 ENCOUNTER — Ambulatory Visit (HOSPITAL_BASED_OUTPATIENT_CLINIC_OR_DEPARTMENT_OTHER): Payer: Medicare Other | Admitting: Anesthesiology

## 2017-11-29 ENCOUNTER — Encounter (HOSPITAL_BASED_OUTPATIENT_CLINIC_OR_DEPARTMENT_OTHER)
Admission: RE | Disposition: A | Discharge status: Home / Self Care | Payer: Self-pay | Source: Ambulatory Visit | Attending: Urology

## 2017-11-29 ENCOUNTER — Encounter (HOSPITAL_BASED_OUTPATIENT_CLINIC_OR_DEPARTMENT_OTHER): Payer: Self-pay | Admitting: *Deleted

## 2017-11-29 DIAGNOSIS — Z7984 Long term (current) use of oral hypoglycemic drugs: Secondary | ICD-10-CM | POA: Insufficient documentation

## 2017-11-29 DIAGNOSIS — Z79899 Other long term (current) drug therapy: Secondary | ICD-10-CM | POA: Diagnosis not present

## 2017-11-29 DIAGNOSIS — N3592 Unspecified urethral stricture, female: Secondary | ICD-10-CM | POA: Insufficient documentation

## 2017-11-29 DIAGNOSIS — E1122 Type 2 diabetes mellitus with diabetic chronic kidney disease: Secondary | ICD-10-CM | POA: Insufficient documentation

## 2017-11-29 DIAGNOSIS — N183 Chronic kidney disease, stage 3 (moderate): Secondary | ICD-10-CM | POA: Insufficient documentation

## 2017-11-29 DIAGNOSIS — I129 Hypertensive chronic kidney disease with stage 1 through stage 4 chronic kidney disease, or unspecified chronic kidney disease: Secondary | ICD-10-CM | POA: Diagnosis not present

## 2017-11-29 HISTORY — DX: Dyspnea, unspecified: R06.00

## 2017-11-29 HISTORY — PX: CYSTOSCOPY WITH URETHRAL DILATATION: SHX5125

## 2017-11-29 HISTORY — DX: Dorsalgia, unspecified: M54.9

## 2017-11-29 HISTORY — DX: Other chronic pain: G89.29

## 2017-11-29 LAB — GLUCOSE, CAPILLARY
GLUCOSE-CAPILLARY: 118 mg/dL — AB (ref 70–99)
Glucose-Capillary: 126 mg/dL — ABNORMAL HIGH (ref 70–99)

## 2017-11-29 SURGERY — CYSTOSCOPY, WITH URETHRAL DILATION
Anesthesia: Monitor Anesthesia Care | Site: Urethra

## 2017-11-29 MED ORDER — LIDOCAINE HCL (CARDIAC) PF 100 MG/5ML IV SOSY
PREFILLED_SYRINGE | INTRAVENOUS | Status: DC | PRN
  Administered 2017-11-29: 30 mg via INTRAVENOUS

## 2017-11-29 MED ORDER — CEFAZOLIN SODIUM-DEXTROSE 2-4 GM/100ML-% IV SOLN
2.0000 g | INTRAVENOUS | Status: AC
  Administered 2017-11-29: 2 g via INTRAVENOUS
  Filled 2017-11-29: qty 100

## 2017-11-29 MED ORDER — SODIUM CHLORIDE 0.9 % IV SOLN
INTRAVENOUS | Status: DC
  Administered 2017-11-29: 11:00:00 via INTRAVENOUS
  Filled 2017-11-29: qty 1000

## 2017-11-29 MED ORDER — OXYCODONE HCL 5 MG PO TABS
5.0000 mg | ORAL_TABLET | Freq: Once | ORAL | Status: DC | PRN
  Filled 2017-11-29: qty 1

## 2017-11-29 MED ORDER — PROPOFOL 10 MG/ML IV BOLUS
INTRAVENOUS | Status: DC | PRN
  Administered 2017-11-29 (×2): 20 mg via INTRAVENOUS

## 2017-11-29 MED ORDER — CEFAZOLIN SODIUM-DEXTROSE 2-4 GM/100ML-% IV SOLN
INTRAVENOUS | Status: AC
  Filled 2017-11-29: qty 100

## 2017-11-29 MED ORDER — FENTANYL CITRATE (PF) 100 MCG/2ML IJ SOLN
INTRAMUSCULAR | Status: AC
  Filled 2017-11-29: qty 2

## 2017-11-29 MED ORDER — PROPOFOL 500 MG/50ML IV EMUL
INTRAVENOUS | Status: DC | PRN
  Administered 2017-11-29: 25 ug/kg/min via INTRAVENOUS

## 2017-11-29 MED ORDER — WHITE PETROLATUM EX OINT
TOPICAL_OINTMENT | CUTANEOUS | Status: AC
  Filled 2017-11-29: qty 5

## 2017-11-29 MED ORDER — FENTANYL CITRATE (PF) 100 MCG/2ML IJ SOLN
INTRAMUSCULAR | Status: DC | PRN
  Administered 2017-11-29: 50 ug via INTRAVENOUS

## 2017-11-29 MED ORDER — PROPOFOL 500 MG/50ML IV EMUL
INTRAVENOUS | Status: AC
  Filled 2017-11-29: qty 50

## 2017-11-29 MED ORDER — HYDROMORPHONE HCL 1 MG/ML IJ SOLN
0.2500 mg | INTRAMUSCULAR | Status: DC | PRN
  Filled 2017-11-29: qty 0.5

## 2017-11-29 MED ORDER — ONDANSETRON HCL 4 MG/2ML IJ SOLN
INTRAMUSCULAR | Status: AC
  Filled 2017-11-29: qty 2

## 2017-11-29 MED ORDER — ONDANSETRON HCL 4 MG/2ML IJ SOLN
INTRAMUSCULAR | Status: DC | PRN
  Administered 2017-11-29: 4 mg via INTRAVENOUS

## 2017-11-29 MED ORDER — PROMETHAZINE HCL 25 MG/ML IJ SOLN
6.2500 mg | INTRAMUSCULAR | Status: DC | PRN
  Filled 2017-11-29: qty 1

## 2017-11-29 MED ORDER — OXYCODONE HCL 5 MG PO TABS: 5.0000 mg | ORAL_TABLET | Freq: Two times a day (BID) | ORAL | 0 refills | Status: DC | PRN

## 2017-11-29 MED ORDER — LIDOCAINE 2% (20 MG/ML) 5 ML SYRINGE
INTRAMUSCULAR | Status: AC
  Filled 2017-11-29: qty 5

## 2017-11-29 MED ORDER — OXYCODONE HCL 5 MG/5ML PO SOLN
5.0000 mg | Freq: Once | ORAL | Status: DC | PRN
  Filled 2017-11-29: qty 5

## 2017-11-29 SURGICAL SUPPLY — 18 items
BAG DRAIN URO-CYSTO SKYTR STRL (DRAIN) ×3 IMPLANT
BAG URINE DRAINAGE (UROLOGICAL SUPPLIES) ×3 IMPLANT
BALLN NEPHROSTOMY (BALLOONS)
BALLOON NEPHROSTOMY (BALLOONS) IMPLANT
CATH FOLEY LATEX FREE 20FR (CATHETERS) ×2
CATH FOLEY LF 20FR (CATHETERS) ×1 IMPLANT
CLOTH BEACON ORANGE TIMEOUT ST (SAFETY) ×3 IMPLANT
GLOVE BIO SURGEON STRL SZ8 (GLOVE) ×3 IMPLANT
GOWN STRL REUS W/ TWL XL LVL3 (GOWN DISPOSABLE) ×1 IMPLANT
GOWN STRL REUS W/TWL XL LVL3 (GOWN DISPOSABLE) ×2
GUIDEWIRE STR DUAL SENSOR (WIRE) ×3 IMPLANT
KIT TURNOVER CYSTO (KITS) ×3 IMPLANT
MANIFOLD NEPTUNE II (INSTRUMENTS) ×3 IMPLANT
NS IRRIG 500ML POUR BTL (IV SOLUTION) IMPLANT
PACK CYSTO (CUSTOM PROCEDURE TRAY) ×3 IMPLANT
TUBE CONNECTING 12'X1/4 (SUCTIONS) ×1
TUBE CONNECTING 12X1/4 (SUCTIONS) ×2 IMPLANT
WATER STERILE IRR 3000ML UROMA (IV SOLUTION) ×3 IMPLANT

## 2017-11-29 NOTE — Anesthesia Preprocedure Evaluation (Signed)
Anesthesia Evaluation  Patient identified by MRN, date of birth, ID band Patient awake    Reviewed: Allergy & Precautions, NPO status , Patient's Chart, lab work & pertinent test results, reviewed documented beta blocker date and time   Airway Mallampati: II  TM Distance: >3 FB Neck ROM: Full    Dental no notable dental hx.    Pulmonary neg pulmonary ROS,    Pulmonary exam normal breath sounds clear to auscultation       Cardiovascular hypertension, Pt. on medications and Pt. on home beta blockers negative cardio ROS Normal cardiovascular exam Rhythm:Regular Rate:Normal     Neuro/Psych  Headaches, PSYCHIATRIC DISORDERS Depression    GI/Hepatic negative GI ROS, Neg liver ROS,   Endo/Other  diabetes, Type 2, Oral Hypoglycemic AgentsHypothyroidism   Renal/GU negative Renal ROS  negative genitourinary   Musculoskeletal  (+) Arthritis ,   Abdominal (+) + obese,   Peds negative pediatric ROS (+)  Hematology negative hematology ROS (+)   Anesthesia Other Findings   Reproductive/Obstetrics negative OB ROS                             Anesthesia Physical Anesthesia Plan  ASA: III  Anesthesia Plan: MAC   Post-op Pain Management:    Induction: Intravenous  PONV Risk Score and Plan: 2 and Ondansetron, Midazolam and Treatment may vary due to age or medical condition  Airway Management Planned: Simple Face Mask  Additional Equipment:   Intra-op Plan:   Post-operative Plan:   Informed Consent: I have reviewed the patients History and Physical, chart, labs and discussed the procedure including the risks, benefits and alternatives for the proposed anesthesia with the patient or authorized representative who has indicated his/her understanding and acceptance.   Dental advisory given  Plan Discussed with: CRNA  Anesthesia Plan Comments:         Anesthesia Quick Evaluation

## 2017-11-29 NOTE — Transfer of Care (Addendum)
Last Vitals:  Vitals Value Taken Time  BP    Temp    Pulse 58 11/29/2017  1:08 PM  Resp 20 11/29/2017  1:08 PM  SpO2 97 % 11/29/2017  1:08 PM  Vitals shown include unvalidated device data.  Last Pain:  Vitals:   11/29/17 1054  TempSrc: Oral  PainSc:       Patients Stated Pain Goal: 8 (11/29/17 1048)  Immediate Anesthesia Transfer of Care Note  Patient: Kelly Barker  Procedure(s) Performed: Procedure(s) (LRB): CYSTOSCOPY WITH URETHRAL DILATATION (N/A)  Patient Location: PACU  Anesthesia Type: MAC  Level of Consciousness: awake, alert  and oriented  Airway & Oxygen Therapy: Patient Spontanous Breathing and Patient connected to face mask oxygen  Post-op Assessment: Report given to PACU RN and Post -op Vital signs reviewed and stable  Post vital signs: Reviewed and stable  Complications: No apparent anesthesia complications

## 2017-11-29 NOTE — Brief Op Note (Signed)
11/29/2017  12:48 PM  PATIENT:  Kathlene November  84 y.o. female  PRE-OPERATIVE DIAGNOSIS:  URETHRAL STRICTURE  POST-OPERATIVE DIAGNOSIS:  URETHRAL STRICTURE  PROCEDURE:  Procedure(s) with comments: CYSTOSCOPY WITH URETHRAL DILATATION (N/A) - 30 MINS  SURGEON:  Surgeon(s) and Role:    * McKenzie, Candee Furbish, MD - Primary  PHYSICIAN ASSISTANT:   ASSISTANTS: none   ANESTHESIA:   MAC  EBL:  none   BLOOD ADMINISTERED:none  DRAINS: Urinary Catheter (Foley)   LOCAL MEDICATIONS USED:  NONE  SPECIMEN:  No Specimen  DISPOSITION OF SPECIMEN:  N/A  COUNTS:  YES  TOURNIQUET:  * No tourniquets in log *  DICTATION: .Note written in EPIC  PLAN OF CARE: Discharge to home after PACU  PATIENT DISPOSITION:  PACU - hemodynamically stable.   Delay start of Pharmacological VTE agent (>24hrs) due to surgical blood loss or risk of bleeding: not applicable

## 2017-11-29 NOTE — Discharge Instructions (Signed)
Indwelling Urinary Catheter Care, Adult °Take good care of your catheter to keep it working and to prevent problems. °How to wear your catheter °Attach your catheter to your leg with tape (adhesive tape) or a leg strap. Make sure it is not too tight. If you use tape, remove any bits of tape that are already on the catheter. °How to wear a drainage bag °You should have: °· A large overnight bag. °· A small leg bag. ° °Overnight Bag °You may wear the overnight bag at any time. Always keep the bag below the level of your bladder but off the floor. When you sleep, put a clean plastic bag in a wastebasket. Then hang the bag inside the wastebasket. °Leg Bag °Never wear the leg bag at night. Always wear the leg bag below your knee. Keep the leg bag secure with a leg strap or tape. °How to care for your skin °· Clean the skin around the catheter at least once every day. °· Shower every day. Do not take baths. °· Put creams, lotions, or ointments on your genital area only as told by your doctor. °· Do not use powders, sprays, or lotions on your genital area. °How to clean your catheter and your skin °1. Wash your hands with soap and water. °2. Wet a washcloth in warm water and gentle (mild) soap. °3. Use the washcloth to clean the skin where the catheter enters your body. Clean downward and wipe away from the catheter in small circles. Do not wipe toward the catheter. °4. Pat the area dry with a clean towel. Make sure to clean off all soap. °How to care for your drainage bags °Empty your drainage bag when it is ?-½ full or at least 2-3 times a day. Replace your drainage bag once a month or sooner if it starts to smell bad or look dirty. Do not clean your drainage bag unless told by your doctor. °Emptying a drainage bag ° °Supplies Needed °· Rubbing alcohol. °· Gauze pad or cotton ball. °· Tape or a leg strap. ° °Steps °1. Wash your hands with soap and water. °2. Separate (detach) the bag from your leg. °3. Hold the bag over  the toilet or a clean container. Keep the bag below your hips and bladder. This stops pee (urine) from going back into the tube. °4. Open the pour spout at the bottom of the bag. °5. Empty the pee into the toilet or container. Do not let the pour spout touch any surface. °6. Put rubbing alcohol on a gauze pad or cotton ball. °7. Use the gauze pad or cotton ball to clean the pour spout. °8. Close the pour spout. °9. Attach the bag to your leg with tape or a leg strap. °10. Wash your hands. ° °Changing a drainage bag °Supplies Needed °· Alcohol wipes. °· A clean drainage bag. °· Adhesive tape or a leg strap. ° °Steps °1. Wash your hands with soap and water. °2. Separate the dirty bag from your leg. °3. Pinch the rubber catheter with your fingers so that pee does not spill out. °4. Separate the catheter tube from the drainage tube where these tubes connect (at the connection valve). Do not let the tubes touch any surface. °5. Clean the end of the catheter tube with an alcohol wipe. Use a different alcohol wipe to clean the end of the drainage tube. °6. Connect the catheter tube to the drainage tube of the clean bag. °7. Attach the new bag to   the leg with adhesive tape or a leg strap. °8. Wash your hands. ° °How to prevent infection and other problems °· Never pull on your catheter or try to remove it. Pulling can damage tissue in your body. °· Always wash your hands before and after touching your catheter. °· If a leg strap gets wet, replace it with a dry one. °· Drink enough fluids to keep your pee clear or pale yellow, or as told by your doctor. °· Do not let the drainage bag or tubing touch the floor. °· Wear cotton underwear. °· If you are female, wipe from front to back after you poop (have a bowel movement). °· Check on the catheter often to make sure it works and the tubing is not twisted. °Get help if: °· Your pee is cloudy. °· Your pee smells unusually bad. °· Your pee is not draining into the bag. °· Your  tube gets clogged. °· Your catheter starts to leak. °· Your bladder feels full. °Get help right away if: °· You have redness, swelling, or pain where the catheter enters your body. °· You have fluid, pus, or a bad smell coming from the area where the catheter enters your body. °· The area where the catheter enters your body feels warm. °· You have a fever. °· You have pain in your: °? Stomach (abdomen). °? Legs. °? Lower back. °? Bladder. °· You see blood fill the catheter. °· Your pee is pink or red. °· You feel sick to your stomach (nauseous). °· You throw up (vomit). °· You have chills. °· Your catheter gets pulled out. °This information is not intended to replace advice given to you by your health care provider. Make sure you discuss any questions you have with your health care provider. °Document Released: 05/01/2012 Document Revised: 12/03/2015 Document Reviewed: 06/19/2013 °Elsevier Interactive Patient Education © 2018 [employer] ° ° ° ° °Post Anesthesia Home Care Instructions ° °Activity: °Get plenty of rest for the remainder of the day. A responsible individual must stay with you for 24 hours following the procedure.  °For the next 24 hours, DO NOT: °-Drive a car °-Operate machinery °-Drink alcoholic beverages °-Take any medication unless instructed by your physician °-Make any legal decisions or sign important papers. ° °Meals: °Start with liquid foods such as gelatin or soup. Progress to regular foods as tolerated. Avoid greasy, spicy, heavy foods. If nausea and/or vomiting occur, drink only clear liquids until the nausea and/or vomiting subsides. Call your physician if vomiting continues. ° °Special Instructions/Symptoms: °Your throat may feel dry or sore from the anesthesia or the breathing tube placed in your throat during surgery. If this causes discomfort, gargle with warm salt water. The discomfort should disappear within 24 hours. ° °If you had a scopolamine patch placed behind your ear for the  management of post- operative nausea and/or vomiting: ° °1. The medication in the patch is effective for 72 hours, after which it should be removed.  Wrap patch in a tissue and discard in the trash. Wash hands thoroughly with soap and water. °2. You may remove the patch earlier than 72 hours if you experience unpleasant side effects which may include dry mouth, dizziness or visual disturbances. °3. Avoid touching the patch. Wash your hands with soap and water after contact with the patch. °  ° °

## 2017-11-29 NOTE — H&P (Signed)
Urology Admission H&P  Chief Complaint: weak urinary stream  History of Present Illness: [name] is a 84yo with a weak urinary stream who was found to have a urethral stricture on exam. She has severe LUTS that have been refractory to medical therapy.   Past Medical History:  Diagnosis Date  . Alopecia (capitis) totalis   . Chronic back pain   . Chronic headaches    migraines until menopause none since  . Chronic renal disease, stage 3, moderately decreased glomerular filtration rate between 30-59 mL/min/1.73 square meter (HCC)   . Degenerative joint disease   . Depression   . Diabetes mellitus due to underlying condition with diabetic chronic kidney disease (HCC)    type 2  . Diverticulitis 08/2016  . Diverticulosis   . Dyspnea    with exertion  . Hematuria, microscopic   . Hyperlipidemia   . Hypertension   . Hypothyroidism   . Osteopenia   . Thrombocytopenia (HCC)   . Vitamin D deficiency    Past Surgical History:  Procedure Laterality Date  . bladder stretching surgery  yrs ago   x 2  . CATARACT EXTRACTION, BILATERAL  6/18, 8/18    Home Medications:  Current Facility-Administered Medications  Medication Dose Route Frequency Provider Last Rate Last Dose  . 0.9 %  sodium chloride infusion   Intravenous Continuous Beryle Lathe, MD 50 mL/hr at 11/29/17 1101    . ceFAZolin (ANCEF) IVPB 2g/100 mL premix  2 g Intravenous 30 min Pre-Op Naava Janeway, Mardene Celeste, MD       Allergies:  Allergies  Allergen Reactions  . Crestor [Rosuvastatin] Other (See Comments)    Muscle cramps  . Lisinopril Other (See Comments)    Decreased GFR.  Marland Kitchen Pravachol [Pravastatin Sodium] Other (See Comments)    Leg cramps.  . Simvastatin Other (See Comments)    Leg cramps  . Tolectin [Tolmetin] Swelling    Family History  Problem Relation Age of Onset  . Uterine cancer Mother   . Bladder Cancer Father   . Dementia Sister    Social History:  reports that she has never smoked. She has never  used smokeless tobacco. She reports that she does not drink alcohol or use drugs.  Review of Systems  Genitourinary: Positive for dysuria, frequency and urgency.  All other systems reviewed and are negative.   Physical Exam:  Vital signs in last 24 hours: Temp:  [98.3 F (36.8 C)] 98.3 F (36.8 C) (11/12 1054) Pulse Rate:  [61] 61 (11/12 1054) Resp:  [16] 16 (11/12 1054) BP: (148)/(62) 148/62 (11/12 1054) SpO2:  [95 %] 95 % (11/12 1054) Physical Exam  Constitutional: She is oriented to person, place, and time. She appears well-developed and well-nourished.  HENT:  Head: Normocephalic and atraumatic.  Eyes: Pupils are equal, round, and reactive to light. EOM are normal.  Neck: Normal range of motion. No thyromegaly present.  Cardiovascular: Normal rate and regular rhythm.  Respiratory: Effort normal. No respiratory distress.  GI: Soft. She exhibits no distension.  Musculoskeletal: Normal range of motion. She exhibits no edema.  Neurological: She is alert and oriented to person, place, and time.  Skin: Skin is warm and dry.  Psychiatric: She has a normal mood and affect. Her behavior is normal. Judgment and thought content normal.    Laboratory Data:  Results for orders placed or performed during the hospital encounter of 11/29/17 (from the past 24 hour(s))  Glucose, capillary     Status: Abnormal  Collection Time: 11/29/17 11:03 AM  Result Value Ref Range   Glucose-Capillary 118 (H) 70 - 99 mg/dL   Comment 1 Document in Chart    No results found for this or any previous visit (from the past 240 hour(s)). Creatinine: No results for input(s): CREATININE in the last 168 hours. Baseline Creatinine: unknown  Impression/Assessment:  84yo with a urethral stricture  Plan:  The risks/benefits/alternatives to urethral dilation was explained to the patient and she understands and wishes to proceed with surgery  [name] 11/29/2017, 12:22 PM

## 2017-11-30 ENCOUNTER — Encounter (HOSPITAL_BASED_OUTPATIENT_CLINIC_OR_DEPARTMENT_OTHER): Payer: Self-pay | Admitting: Urology

## 2017-11-30 NOTE — Anesthesia Postprocedure Evaluation (Signed)
Anesthesia Post Note  Patient: Kelly Barker  Procedure(s) Performed: CYSTOSCOPY WITH URETHRAL DILATATION (N/A Urethra)     Patient location during evaluation: PACU Anesthesia Type: MAC Level of consciousness: awake and alert Pain management: pain level controlled Vital Signs Assessment: post-procedure vital signs reviewed and stable Respiratory status: spontaneous breathing, nonlabored ventilation and respiratory function stable Cardiovascular status: stable and blood pressure returned to baseline Postop Assessment: no apparent nausea or vomiting Anesthetic complications: no    Last Vitals:  Vitals:   11/29/17 1330 11/29/17 1407  BP: (!) 134/44 (!) 140/58  Pulse: (!) 59 60  Resp: 20 16  Temp:  36.6 C  SpO2: 95% 94%    Last Pain:  Vitals:   11/29/17 1407  TempSrc:   PainSc: 0-No pain                 Lynda Rainwater

## 2017-12-12 NOTE — Op Note (Signed)
Preoperative diagnosis: urethral stricture  Postoperative diagnosis: Same  Procedure: 1. Cystoscopy 2. Urethral dilation  Attending: Wilkie AyePatrick Sadye Kiernan  Anesthesia: General  History of blood loss: Minimal  Antibiotics: Ancef  Specimens: none  Drains: 18 french silicone catheter  Findings: dense short urethral stricture.  No masses/lesions in the bladder.  Indications: Patient is a 84 year old female with a history of difficulty with urination and was found to have a urethral stricture. After discussing treatment options the patient wishes to proceed with urethral dilation.  Procedure in detail: Prior to procedure consent was obtained.  Patient was brought to the operating room and a brief timeout was done to ensure correct patient, correct procedure, correct site.  General anesthesia was administered and patient was placed in dorsal lithotomy position.  Her genitalia was then prepped and draped in usual sterile fashion. Using the Hagar dilators the urethra was dilated to 20 french. We then passed a 17 French cystoscope into the bladder. We inspected the bladder and no lesions, masses, or calculi were noted. We then removed the cystoscope and placed an 18 French silicone catheter.  This then concluded the procedure which was well tolerated by the patient.  Complications: None  Condition: Stable, extubated, transferred to PACU.  Plan: Patient is to be discharged home.  She is to follow up in 1 week for voiding trial.

## 2018-02-01 ENCOUNTER — Other Ambulatory Visit: Payer: Self-pay | Admitting: Geriatric Medicine

## 2018-02-01 DIAGNOSIS — M5442 Lumbago with sciatica, left side: Secondary | ICD-10-CM

## 2018-02-02 ENCOUNTER — Ambulatory Visit
Admission: RE | Admit: 2018-02-02 | Discharge: 2018-02-02 | Disposition: A | Discharge status: Home / Self Care | Payer: Medicare Other | Source: Ambulatory Visit | Attending: Geriatric Medicine | Admitting: Geriatric Medicine

## 2018-02-02 DIAGNOSIS — M5442 Lumbago with sciatica, left side: Secondary | ICD-10-CM

## 2018-02-10 ENCOUNTER — Emergency Department (HOSPITAL_COMMUNITY)
Admission: EM | Admit: 2018-02-10 | Discharge: 2018-02-10 | Disposition: A | Discharge status: Home / Self Care | Payer: Medicare Other | Attending: Emergency Medicine | Admitting: Emergency Medicine

## 2018-02-10 ENCOUNTER — Encounter (HOSPITAL_COMMUNITY): Payer: Self-pay | Admitting: *Deleted

## 2018-02-10 ENCOUNTER — Other Ambulatory Visit: Payer: Self-pay

## 2018-02-10 DIAGNOSIS — N183 Chronic kidney disease, stage 3 (moderate): Secondary | ICD-10-CM | POA: Insufficient documentation

## 2018-02-10 DIAGNOSIS — M48061 Spinal stenosis, lumbar region without neurogenic claudication: Secondary | ICD-10-CM

## 2018-02-10 DIAGNOSIS — F329 Major depressive disorder, single episode, unspecified: Secondary | ICD-10-CM | POA: Diagnosis not present

## 2018-02-10 DIAGNOSIS — E039 Hypothyroidism, unspecified: Secondary | ICD-10-CM | POA: Insufficient documentation

## 2018-02-10 DIAGNOSIS — Z79899 Other long term (current) drug therapy: Secondary | ICD-10-CM | POA: Diagnosis not present

## 2018-02-10 DIAGNOSIS — M549 Dorsalgia, unspecified: Secondary | ICD-10-CM | POA: Diagnosis present

## 2018-02-10 DIAGNOSIS — E1122 Type 2 diabetes mellitus with diabetic chronic kidney disease: Secondary | ICD-10-CM | POA: Insufficient documentation

## 2018-02-10 DIAGNOSIS — M5432 Sciatica, left side: Secondary | ICD-10-CM

## 2018-02-10 DIAGNOSIS — I129 Hypertensive chronic kidney disease with stage 1 through stage 4 chronic kidney disease, or unspecified chronic kidney disease: Secondary | ICD-10-CM | POA: Insufficient documentation

## 2018-02-10 DIAGNOSIS — Z7984 Long term (current) use of oral hypoglycemic drugs: Secondary | ICD-10-CM | POA: Diagnosis not present

## 2018-02-10 DIAGNOSIS — M5126 Other intervertebral disc displacement, lumbar region: Secondary | ICD-10-CM | POA: Diagnosis not present

## 2018-02-10 MED ORDER — PREDNISONE 10 MG (21) PO TBPK: ORAL_TABLET | ORAL | 0 refills | Status: DC

## 2018-02-10 MED ORDER — HYDROCODONE-ACETAMINOPHEN 5-325 MG PO TABS: 1.0000 | ORAL_TABLET | Freq: Once | ORAL | Status: DC

## 2018-02-10 MED ORDER — OXYCODONE-ACETAMINOPHEN 5-325 MG PO TABS: 2.0000 | ORAL_TABLET | ORAL | 0 refills | Status: DC | PRN

## 2018-02-10 MED ORDER — DEXAMETHASONE SODIUM PHOSPHATE 10 MG/ML IJ SOLN
10.0000 mg | Freq: Once | INTRAMUSCULAR | Status: AC
  Administered 2018-02-10: 10 mg via INTRAMUSCULAR
  Filled 2018-02-10: qty 1

## 2018-02-10 MED ORDER — OXYCODONE-ACETAMINOPHEN 5-325 MG PO TABS
1.0000 | ORAL_TABLET | Freq: Once | ORAL | Status: AC
  Administered 2018-02-10: 1 via ORAL
  Filled 2018-02-10: qty 1

## 2018-02-10 NOTE — ED Provider Notes (Signed)
[facility] EMERGENCY DEPARTMENT Provider Note   CSN: [id number] Arrival date & time: 02/10/18  1143     History   Chief Complaint Chief Complaint  Patient presents with  . Back Pain    HPI Kelly Barker is a 85 y.o. female.  Pt presents to the ED today with left sided back pain with pain radiating down her left leg.  Pt has been hurting in her back since November, and had a MRI on 1/16 which showed:  IMPRESSION: Moderately large extruded disc fragment on the left at L4-5 with downgoing disc material causing compression of the left L5 nerve root. Moderate to severe spinal stenosis  Moderate to severe spinal stenosis L5-S1 with subarticular stenosis Bilaterally.  The pt said she was referred to NS who recommended epidural injections.  That is not scheduled for a few more weeks.  She was having worsening of pain, so she called the office to try to get in sooner, but they could not.  They told her to go to the ED.      Past Medical History:  Diagnosis Date  . Alopecia (capitis) totalis   . Chronic back pain   . Chronic headaches    migraines until menopause none since  . Chronic renal disease, stage 3, moderately decreased glomerular filtration rate between 30-59 mL/min/1.73 square meter (HCC)   . Degenerative joint disease   . Depression   . Diabetes mellitus due to underlying condition with diabetic chronic kidney disease (HCC)    type 2  . Diverticulitis 08/2016  . Diverticulosis   . Dyspnea    with exertion  . Hematuria, microscopic   . Hyperlipidemia   . Hypertension   . Hypothyroidism   . Osteopenia   . Thrombocytopenia (HCC)   . Vitamin D deficiency     Patient Active Problem List   Diagnosis Date Noted  . Diverticulitis 09/05/2016  . Essential hypertension 09/05/2016  . Diabetes mellitus type 2 in obese (HCC) 09/05/2016  . Hypothyroidism 09/05/2016  . CKD (chronic kidney disease) stage 3, GFR 30-59 ml/min (HCC) 09/05/2016  .  History of left cataract surgery 09/05/2016    Past Surgical History:  Procedure Laterality Date  . bladder stretching surgery  yrs ago   x 2  . CATARACT EXTRACTION, BILATERAL  6/18, 8/18  . CYSTOSCOPY WITH URETHRAL DILATATION N/A 11/29/2017   Procedure: CYSTOSCOPY WITH URETHRAL DILATATION;  Surgeon: Malen Gauze, MD;  Location: [facility];  Service: Urology;  Laterality: N/A;  30 MINS     OB History   No obstetric history on file.      Home Medications    Prior to Admission medications   Medication Sig Start Date End Date Taking? Authorizing Provider  acetaminophen (TYLENOL) 325 MG tablet Take 325 mg by mouth every 6 (six) hours as needed for mild pain.    [provider]  amLODipine (NORVASC) 5 MG tablet Take 5 mg by mouth daily.    [provider]  amoxicillin-clavulanate (AUGMENTIN) 875-125 MG tablet Take 1 tablet by mouth 2 (two) times daily. Finished 11-20-17 for acute diverticulitis    [provider]  atenolol (TENORMIN) 50 MG tablet Take 50 mg by mouth daily.    [provider]  Bismuth Subsalicylate 525 MG/15ML SUSP Take by mouth as needed.    [provider]  levothyroxine (SYNTHROID, LEVOTHROID) 137 MCG tablet Take 137 mcg by mouth daily before breakfast.     [provider]  LORazepam (ATIVAN) 1 MG tablet Take 1 mg by mouth 2 (two) times daily. Take one tablet twice daily.    [provider]  metFORMIN (GLUCOPHAGE-XR) 500 MG 24 hr tablet Take 500 mg by mouth daily.     [provider]  oxyCODONE (ROXICODONE) 5 MG immediate release tablet Take 1 tablet (5 mg total) by mouth every 12 (twelve) hours as needed for severe pain. 11/29/17   McKenzie, Mardene Celeste, MD  oxyCODONE-acetaminophen (PERCOCET/ROXICET) 5-325 MG tablet Take 2 tablets by mouth every 4 (four) hours as needed for severe pain. 02/10/18   Jacalyn Lefevre, MD  predniSONE (STERAPRED UNI-PAK 21 TAB) 10 MG (21) TBPK tablet  Take 6 tabs for 2 days, then 5 for 2 days, then 4 for 2 days, then 3 for 2 days, 2 for 2 days, then 1 for 2 days 02/10/18   Jacalyn Lefevre, MD  sertraline (ZOLOFT) 50 MG tablet Take 50 mg by mouth daily.    [provider]  traZODone (DESYREL) 50 MG tablet Take 50 mg by mouth at bedtime.    [provider]  triamterene-hydrochlorothiazide (DYAZIDE) 37.5-25 MG per capsule Take 1 capsule by mouth every morning.    [provider]    Family History Family History  Problem Relation Age of Onset  . Uterine cancer Mother   . Bladder Cancer Father   . Dementia Sister     Social History Social History   Tobacco Use  . Smoking status: Never Smoker  . Smokeless tobacco: Never Used  Substance Use Topics  . Alcohol use: No  . Drug use: No     Allergies   Crestor [rosuvastatin]; Lisinopril; Pravachol [pravastatin sodium]; Simvastatin; and Tolectin [tolmetin]   Review of Systems Review of Systems  Musculoskeletal: Positive for back pain.  All other systems reviewed and are negative.    Physical Exam Updated Vital Signs BP (!) 175/69 (BP Location: Right Arm)   Pulse 72   Temp 97.8 F (36.6 C) (Oral)   Resp 20   Ht 5\' 4"  (1.626 m)   Wt 81.6 kg   SpO2 98%   BMI 30.90 kg/m   Physical Exam Vitals signs and nursing note reviewed.  Constitutional:      Appearance: Normal appearance.  HENT:     Head: Normocephalic and atraumatic.     Right Ear: External ear normal.     Left Ear: External ear normal.     Nose: Nose normal.     Mouth/Throat:     Mouth: Mucous membranes are moist.     Pharynx: Oropharynx is clear.  Eyes:     Extraocular Movements: Extraocular movements intact.     Conjunctiva/sclera: Conjunctivae normal.     Pupils: Pupils are equal, round, and reactive to light.  Neck:     Musculoskeletal: Normal range of motion and neck supple.  Cardiovascular:     Rate and Rhythm: Normal rate and regular rhythm.     Pulses: Normal pulses.      Heart sounds: Normal heart sounds.  Pulmonary:     Effort: Pulmonary effort is normal.     Breath sounds: Normal breath sounds.  Abdominal:     General: Abdomen is flat.  Musculoskeletal:       Arms:  Skin:    General: Skin is warm.     Capillary Refill: Capillary refill takes less than 2 seconds.  Neurological:     General: No focal deficit present.     Mental Status: She is  alert and oriented to person, place, and time.  Psychiatric:        Mood and Affect: Mood normal.        Behavior: Behavior normal.      ED Treatments / Results  Labs (all labs ordered are listed, but only abnormal results are displayed) Labs Reviewed - No data to display  EKG None  Radiology No results found.  Procedures Procedures (including critical care time)  Medications Ordered in ED Medications  dexamethasone (DECADRON) injection 10 mg (has no administration in time range)  oxyCODONE-acetaminophen (PERCOCET/ROXICET) 5-325 MG per tablet 1 tablet (has no administration in time range)     Initial Impression / Assessment and Plan / ED Course  I have reviewed the triage vital signs and the nursing notes.  Pertinent labs & imaging results that were available during my care of the patient were reviewed by me and considered in my medical decision making (see chart for details).     Pt will be given decadron and percocet in ED and sent home with prednisone and percocet.  She knows to take extra fiber/stool softeners while on opiates.  She is told to watch her sugar while on the prednisone.  Final Clinical Impressions(s) / ED Diagnoses   Final diagnoses:  Sciatica of left side  Spinal stenosis of lumbar region, unspecified whether neurogenic claudication present  Herniation of intervertebral disc between L4 and L5    ED Discharge Orders         Ordered    predniSONE (STERAPRED UNI-PAK 21 TAB) 10 MG (21) TBPK tablet     02/10/18 1232    oxyCODONE-acetaminophen (PERCOCET/ROXICET) 5-325  MG tablet  Every 4 hours PRN     02/10/18 1232           Jacalyn LefevreHaviland, Mersadez Linden, MD 02/10/18 1233

## 2018-02-10 NOTE — ED Triage Notes (Signed)
PT states she has a ruptured L4 disc and cannot get an appointment to see MD for steroid shots until 30th.   States pain is becoming unbearable and she needs some pain control.

## 2018-02-10 NOTE — ED Notes (Signed)
Patient verbalized understanding of discharge instructions and denies any further needs or questions at this time. VS stable. Patient ambulatory with steady gait. Assisted to ED entrance in wheelchair.   

## 2018-05-29 ENCOUNTER — Other Ambulatory Visit: Payer: Self-pay | Admitting: Geriatric Medicine

## 2018-05-29 DIAGNOSIS — M79604 Pain in right leg: Secondary | ICD-10-CM

## 2018-05-30 ENCOUNTER — Other Ambulatory Visit: Payer: Medicare Other

## 2018-08-07 ENCOUNTER — Other Ambulatory Visit: Payer: Self-pay

## 2018-08-07 ENCOUNTER — Emergency Department (HOSPITAL_COMMUNITY): Payer: Medicare Other

## 2018-08-07 ENCOUNTER — Encounter (HOSPITAL_COMMUNITY): Payer: Self-pay | Admitting: Emergency Medicine

## 2018-08-07 ENCOUNTER — Emergency Department (HOSPITAL_COMMUNITY)
Admission: EM | Admit: 2018-08-07 | Discharge: 2018-08-07 | Disposition: A | Discharge status: Home / Self Care | Payer: Medicare Other | Attending: Emergency Medicine | Admitting: Emergency Medicine

## 2018-08-07 DIAGNOSIS — N183 Chronic kidney disease, stage 3 (moderate): Secondary | ICD-10-CM | POA: Insufficient documentation

## 2018-08-07 DIAGNOSIS — I129 Hypertensive chronic kidney disease with stage 1 through stage 4 chronic kidney disease, or unspecified chronic kidney disease: Secondary | ICD-10-CM | POA: Insufficient documentation

## 2018-08-07 DIAGNOSIS — E039 Hypothyroidism, unspecified: Secondary | ICD-10-CM | POA: Insufficient documentation

## 2018-08-07 DIAGNOSIS — M5432 Sciatica, left side: Secondary | ICD-10-CM | POA: Diagnosis not present

## 2018-08-07 DIAGNOSIS — Z7984 Long term (current) use of oral hypoglycemic drugs: Secondary | ICD-10-CM | POA: Insufficient documentation

## 2018-08-07 DIAGNOSIS — E1122 Type 2 diabetes mellitus with diabetic chronic kidney disease: Secondary | ICD-10-CM | POA: Insufficient documentation

## 2018-08-07 DIAGNOSIS — Z79899 Other long term (current) drug therapy: Secondary | ICD-10-CM | POA: Insufficient documentation

## 2018-08-07 DIAGNOSIS — M5489 Other dorsalgia: Secondary | ICD-10-CM | POA: Diagnosis present

## 2018-08-07 MED ORDER — KETOROLAC TROMETHAMINE 60 MG/2ML IM SOLN
15.0000 mg | Freq: Once | INTRAMUSCULAR | Status: AC
  Administered 2018-08-07: 15 mg via INTRAMUSCULAR
  Filled 2018-08-07: qty 2

## 2018-08-07 MED ORDER — OXYCODONE HCL 5 MG PO TABS
2.5000 mg | ORAL_TABLET | Freq: Once | ORAL | Status: AC
  Administered 2018-08-07: 2.5 mg via ORAL
  Filled 2018-08-07: qty 1

## 2018-08-07 MED ORDER — DICLOFENAC SODIUM 1 % TD GEL: 4.0000 g | Freq: Four times a day (QID) | TRANSDERMAL | 0 refills | Status: AC

## 2018-08-07 NOTE — ED Notes (Signed)
Patient states she has ruptured disc and neuro surgeon had her trying shots during covid.  She stood up last night and experienced "excruciating" pain on back right side when she stood.

## 2018-08-07 NOTE — ED Triage Notes (Signed)
Pt reports a slipped disk from October and today she is having excruciating pain all the way across the lower back.

## 2018-08-07 NOTE — ED Notes (Signed)
Patient Alert and oriented to baseline. Stable and ambulatory to baseline. Patient verbalized understanding of the discharge instructions.  Patient belongings were taken by the patient.   

## 2018-08-07 NOTE — Discharge Instructions (Signed)
Your back pain is most likely due to a muscular strain.  There is been a lot of research on back pain, unfortunately the only thing that seems to really help is Tylenol and ibuprofen.  Relative rest is also important to not lift greater than 10 pounds bending or twisting at the waist.  Please follow-up with your family physician.  The other thing that really seems to benefit patients is physical therapy which your doctor may send you for.  Please return to the emergency department for new numbness or weakness to your arms or legs. Difficulty with urinating or urinating or pooping on yourself.  Also if you cannot feel toilet paper when you wipe or get a fever.   Also take tylenol 1000mg (2 extra strength) four times a day. Use the gel as prescribed.  Follow up with your PCP.

## 2018-08-07 NOTE — ED Provider Notes (Signed)
[facility]ERGENCY [facility]PART[facility] Provider Note   CSN: [id number] Arrival date & time: 08/07/18  1341    History   Chief Complaint Chief Complaint  Patient presents with  . Back Pain    lower back    HPI Kelly Barker is a 85 y.o. female.     85 yo F with a cc of left sided low back pain.  This is an ongoing issue.  At least for the past 10 months.  Has had CT and MRI imaging.  Seen with spinal injections last in march.  Ongoing pain.  Worsening over the past couple days after attempting to go shopping.  No noted trauma. No fevers.  No bowel or bladder incontinence.    The history is provided by the patient.  Back Pain Location:  Lumbar spine Quality:  Shooting Radiates to:  L foot Pain severity:  Moderate Onset quality:  Gradual Duration:  10 months Timing:  Constant Progression:  Worsening Chronicity:  New Relieved by:  Nothing Worsened by:  Bending Ineffective treatments:  None tried Associated symptoms: no chest pain, no dysuria, no fever and no headaches     Past Medical History:  Diagnosis Date  . Alopecia (capitis) totalis   . Chronic back pain   . Chronic headaches    migraines until menopause none since  . Chronic renal disease, stage 3, moderately decreased glomerular filtration rate between 30-59 mL/min/1.73 square meter (HCC)   . Degenerative joint disease   . Depression   . Diabetes mellitus due to underlying condition with diabetic chronic kidney disease (HCC)    type 2  . Diverticulitis 08/2016  . Diverticulosis   . Dyspnea    with exertion  . Hematuria, microscopic   . Hyperlipidemia   . Hypertension   . Hypothyroidism   . Osteopenia   . Thrombocytopenia (HCC)   . Vitamin D deficiency     Patient Active Problem List   Diagnosis Date Noted  . Diverticulitis 09/05/2016  . Essential hypertension 09/05/2016  . Diabetes mellitus type 2 in obese (HCC) 09/05/2016  . Hypothyroidism 09/05/2016  . CKD (chronic kidney  disease) stage 3, GFR 30-59 ml/min (HCC) 09/05/2016  . History of left cataract surgery 09/05/2016    Past Surgical History:  Procedure Laterality Date  . bladder stretching surgery  yrs ago   x 2  . CATARACT EXTRACTION, BILATERAL  6/18, 8/18  . CYSTOSCOPY WITH URETHRAL DILATATION N/A 11/29/2017   Procedure: CYSTOSCOPY WITH URETHRAL DILATATION;  Surgeon: Malen GauzeMcKenzie, Patrick L, MD;  Location: [facility];  Service: Urology;  Laterality: N/A;  30 MINS     OB History   No obstetric history on file.      Home Medications    Prior to Admission medications   Medication Sig Start Date End Date Taking? Authorizing Provider  acetaminophen (TYLENOL) 325 MG tablet Take 325 mg by mouth every 6 (six) hours as needed for mild pain.    [provider]  amLODipine (NORVASC) 5 MG tablet Take 5 mg by mouth daily.    [provider]  amoxicillin-clavulanate (AUGMENTIN) 875-125 MG tablet Take 1 tablet by mouth 2 (two) times daily. Finished 11-20-17 for acute diverticulitis    [provider]  atenolol (TENORMIN) 50 MG tablet Take 50 mg by mouth daily.    [provider]  Bismuth Subsalicylate 525 MG/15ML SUSP Take by mouth as needed.    [provider]  diclofenac sodium (VOLTAREN) 1 % GEL  Apply 4 g topically 4 (four) times daily. 08/07/18   Melene PlanFloyd, Stefon Ramthun, DO  levothyroxine (SYNTHROID, LEVOTHROID) 137 MCG tablet Take 137 mcg by mouth daily before breakfast.     [provider]  LORazepam (ATIVAN) 1 MG tablet Take 1 mg by mouth 2 (two) times daily. Take one tablet twice daily.    [provider]  metFORMIN (GLUCOPHAGE-XR) 500 MG 24 hr tablet Take 500 mg by mouth daily.     [provider]  oxyCODONE (ROXICODONE) 5 MG immediate release tablet Take 1 tablet (5 mg total) by mouth every 12 (twelve) hours as needed for severe pain. 11/29/17   McKenzie, Mardene CelestePatrick L, MD  oxyCODONE-acetaminophen (PERCOCET/ROXICET) 5-325 MG tablet Take  2 tablets by mouth every 4 (four) hours as needed for severe pain. 02/10/18   Jacalyn LefevreHaviland, Julie, MD  predniSONE (STERAPRED UNI-PAK 21 TAB) 10 MG (21) TBPK tablet Take 6 tabs for 2 days, then 5 for 2 days, then 4 for 2 days, then 3 for 2 days, 2 for 2 days, then 1 for 2 days 02/10/18   Jacalyn LefevreHaviland, Julie, MD  sertraline (ZOLOFT) 50 MG tablet Take 50 mg by mouth daily.    [provider]  traZODone (DESYREL) 50 MG tablet Take 50 mg by mouth at bedtime.    [provider]  triamterene-hydrochlorothiazide (DYAZIDE) 37.5-25 MG per capsule Take 1 capsule by mouth every morning.    [provider]    Family History Family History  Problem Relation Age of Onset  . Uterine cancer Mother   . Bladder Cancer Father   . Dementia Sister     Social History Social History   Tobacco Use  . Smoking status: Never Smoker  . Smokeless tobacco: Never Used  Substance Use Topics  . Alcohol use: No  . Drug use: No     Allergies   Crestor [rosuvastatin], Lisinopril, Pravachol [pravastatin sodium], Simvastatin, and Tolectin [tolmetin]   Review of Systems Review of Systems  Constitutional: Negative for chills and fever.  HENT: Negative for congestion and rhinorrhea.   Eyes: Negative for redness and visual disturbance.  Respiratory: Negative for shortness of breath and wheezing.   Cardiovascular: Negative for chest pain and palpitations.  Gastrointestinal: Negative for nausea and vomiting.  Genitourinary: Negative for dysuria and urgency.  Musculoskeletal: Positive for back pain. Negative for arthralgias and myalgias.  Skin: Negative for pallor and wound.  Neurological: Negative for dizziness and headaches.     Physical Exam Updated Vital Signs BP (!) 183/76 (BP Location: Right Arm)   Pulse 75   Temp (!) 97.5 F (36.4 C) (Oral)   Resp 14   Ht 5\' 4"  (1.626 m)   Wt 78 kg   LMP  (Exact Date)   SpO2 96%   BMI 29.52 kg/m   Physical Exam Vitals signs and nursing note  reviewed.  Constitutional:      General: She is not in acute distress.    Appearance: She is well-developed. She is not diaphoretic.  HENT:     Head: Normocephalic and atraumatic.  Eyes:     Pupils: Pupils are equal, round, and reactive to light.  Neck:     Musculoskeletal: Normal range of motion and neck supple.  Cardiovascular:     Rate and Rhythm: Normal rate and regular rhythm.     Heart sounds: No murmur. No friction rub. No gallop.   Pulmonary:     Effort: Pulmonary effort is normal.     Breath sounds: No wheezing  or rales.  Abdominal:     General: There is no distension.     Palpations: Abdomen is soft.     Tenderness: There is no abdominal tenderness.  Musculoskeletal:        General: No tenderness.     Comments: PMS intact to bilaterally lower extremities.  Reflexes 2+ and equal.  No noted clonus.   Skin:    General: Skin is warm and dry.  Neurological:     Mental Status: She is alert and oriented to person, place, and time.  Psychiatric:        Behavior: Behavior normal.      ED Treatments / Results  Labs (all labs ordered are listed, but only abnormal results are displayed) Labs Reviewed - No data to display  EKG None  Radiology Dg Lumbar Spine Complete  Result Date: 08/07/2018 CLINICAL DATA:  L4-5 ruptured discs with left-sided pain worse today. No injury. EXAM: LUMBAR SPINE - COMPLETE 4+ VIEW COMPARISON:  MRI 02/02/2018 FINDINGS: Vertebral body alignment and heights are normal. There is mild spondylosis throughout the lumbar spine. Moderate disc space narrowing at the L4-5 level with endplate sclerosis. Mild disc space narrowing at the L2-3 level. No compression fracture or spondylolisthesis. Facet arthropathy is present over the lower lumbar spine. IMPRESSION: Mild spondylosis throughout the lumbar spine with moderate disc disease at the L4-5 level greater than the L2-3 level. Electronically Signed   By: Marin Olp M.D.   On: 08/07/2018 14:40     Procedures Procedures (including critical care time)  Medications Ordered in ED Medications  ketorolac (TORADOL) injection 15 mg (has no administration in time range)  oxyCODONE (Oxy IR/ROXICODONE) immediate release tablet 2.5 mg (has no administration in time range)     Initial Impression / Assessment and Plan / ED Course  I have reviewed the triage vital signs and the nursing notes.  Pertinent labs & imaging results that were available during my care of the patient were reviewed by me and considered in my medical decision making (see chart for details).        85 yo F with a cc of left sided low back pain.  Hx of the same.  Prior Ct and MRI imaging.  No trauma, no fevers.  No new neurologic deficit.  No abdominal pain.  No urinary symptoms.    Discussed risks and benefits of imaging.  Patient thinks she needs a pain pill.  Will trial voltaren gel.    PCP and neurosurgical follow up.    5:59 PM:  I have discussed the diagnosis/risks/treatment options with the patient and believe the pt to be eligible for discharge home to follow-up with PCP, neurosurgery. We also discussed returning to the ED immediately if new or worsening sx occur. We discussed the sx which are most concerning (e.g., sudden worsening pain, fever, inability to tolerate by mouth) that necessitate immediate return. Medications administered to the patient during their visit and any new prescriptions provided to the patient are listed below.  Medications given during this visit Medications  ketorolac (TORADOL) injection 15 mg (has no administration in time range)  oxyCODONE (Oxy IR/ROXICODONE) immediate release tablet 2.5 mg (has no administration in time range)     The patient appears reasonably screen and/or stabilized for discharge and I doubt any other medical condition or other [facility] requiring further screening, evaluation, or treatment in the ED at this time prior to discharge.    Final Clinical  Impressions(s) / ED Diagnoses   Final  diagnoses:  Sciatica of left side    ED Discharge Orders         Ordered    diclofenac sodium (VOLTAREN) 1 % GEL  4 times daily     08/07/18 1751           Melene PlanFloyd, Iya Hamed, DO 08/07/18 1759

## 2018-08-10 ENCOUNTER — Other Ambulatory Visit: Payer: Self-pay | Admitting: Neurosurgery

## 2018-08-14 NOTE — Pre-Procedure Instructions (Addendum)
SYMPHANI ECKSTROM  08/14/2018      [facility] - [place], [place] - [address] [place] [address] Phone: [phone] Fax: [phone]    Your procedure is scheduled on August 18, 2018.  Report to [facility] Admitting at 900 AM.  Call this number if you have problems the morning of surgery:  [phone]   Call [phone] if you have any questions prior to your surgery date Monday-Friday 8am-4pm    Remember:  Do not eat or drink after midnight.    Take these medicines the morning of surgery with A SIP OF WATER  Amlodipine (norvasc) Atenolol (tenormin) Levothyroxine (synthroid) Lorazepam (ativan) Gabapentin (Neurontin) Sertraline (zoloft)  Beginning now,  STOP taking any Aspirin (unless otherwise instructed by your surgeon), Aleve, Naproxen, Ibuprofen, Motrin, Advil, Goody's, BC's, all herbal medications, fish oil, and all vitamins   WHAT DO I DO ABOUT MY DIABETES MEDICATION?   Marland Kitchen Do not take oral diabetes medicines (pills) the morning of surgery-Glimepiride (Amaryl)  Reviewed and Endorsed by [facility] Patient Education Committee, August 2015   How to Manage Your Diabetes Before and After Surgery  Why is it important to control my blood sugar before and after surgery? . Improving blood sugar levels before and after surgery helps healing and can limit problems. . A way of improving blood sugar control is eating a healthy diet by: o  Eating less sugar and carbohydrates o  Increasing activity/exercise o  Talking with your doctor about reaching your blood sugar goals . High blood sugars (greater than 180 mg/dL) can raise your risk of infections and slow your recovery, so you will need to focus on controlling your diabetes during the weeks before surgery. . Make sure that the doctor who takes care of your diabetes knows about your planned surgery including the date and location.  How do I manage my blood sugar before  surgery? . Check your blood sugar at least 4 times a day, starting 2 days before surgery, to make sure that the level is not too high or low. o Check your blood sugar the morning of your surgery when you wake up and every 2 hours until you get to the Short Stay unit. . If your blood sugar is less than 70 mg/dL, you will need to treat for low blood sugar: o Do not take insulin. o Treat a low blood sugar (less than 70 mg/dL) with  cup of clear juice (cranberry or apple), 4 glucose tablets, OR glucose gel. Recheck blood sugar in 15 minutes after treatment (to make sure it is greater than 70 mg/dL). If your blood sugar is not greater than 70 mg/dL on recheck, call [phone] o  for further instructions. . Report your blood sugar to the short stay nurse when you get to Short Stay.  . If you are admitted to the hospital after surgery: o Your blood sugar will be checked by the staff and you will probably be given insulin after surgery (instead of oral diabetes medicines) to make sure you have good blood sugar levels. o The goal for blood sugar control after surgery is 80-180 mg/dL.   [facility]- Preparing For Surgery  Before surgery, you can play an important role. Because skin is not sterile, your skin needs to be as free of germs as possible. You can reduce the number of germs on your skin by washing with CHG (chlorahexidine gluconate) Soap before surgery.  CHG  is an antiseptic cleaner which kills germs and bonds with the skin to continue killing germs even after washing.    Oral Hygiene is also important to reduce your risk of infection.  Remember - BRUSH YOUR TEETH THE MORNING OF SURGERY WITH YOUR REGULAR TOOTHPASTE  Please do not use if you have an allergy to CHG or antibacterial soaps. If your skin becomes reddened/irritated stop using the CHG.  Do not shave (including legs and underarms) for at least 48 hours prior to first CHG shower. It is OK to shave your face.  Please follow these  instructions carefully.   1. Shower the NIGHT BEFORE SURGERY and the MORNING OF SURGERY with CHG.   2. If you chose to wash your hair, wash your hair first as usual with your normal shampoo.  3. After you shampoo, rinse your hair and body thoroughly to remove the shampoo.  4. Use CHG as you would any other liquid soap. You can apply CHG directly to the skin and wash gently with a scrungie or a clean washcloth.   5. Apply the CHG Soap to your body ONLY FROM THE NECK DOWN.  Do not use on open wounds or open sores. Avoid contact with your eyes, ears, mouth and genitals (private parts). Wash Face and genitals (private parts)  with your normal soap.  6. Wash thoroughly, paying special attention to the area where your surgery will be performed.  7. Thoroughly rinse your body with warm water from the neck down.  8. DO NOT shower/wash with your normal soap after using and rinsing off the CHG Soap.  9. Pat yourself dry with a CLEAN TOWEL.  10. Wear CLEAN PAJAMAS to bed the night before surgery, wear comfortable clothes the morning of surgery  11. Place CLEAN SHEETS on your bed the night of your first shower and DO NOT SLEEP WITH PETS.  Day of Surgery: Shower as above Do not apply any deodorants/lotions.  Please wear clean clothes to the hospital/surgery center.   Remember to brush your teeth WITH YOUR REGULAR TOOTHPASTE.   Do not wear jewelry, make-up or nail polish.  Do not wear lotions, powders, or perfumes, or deodorant.  Do not shave 48 hours prior to surgery.    Do not bring valuables to the hospital.  [facility] is not responsible for any belongings or valuables.   Remember that you must have someone to transport you home after your surgery, and remain with you for 24 hours if you are discharged the same day.  Contacts, dentures or bridgework may not be worn into surgery.    For patients admitted to the hospital, discharge time will be determined by your treatment  team.  Patients discharged the day of surgery will not be allowed to drive home.   Please read over the following fact sheets that you were given.

## 2018-08-15 ENCOUNTER — Other Ambulatory Visit (HOSPITAL_COMMUNITY)
Admission: RE | Admit: 2018-08-15 | Discharge: 2018-08-15 | Disposition: A | Discharge status: Home / Self Care | Payer: Medicare Other | Source: Ambulatory Visit | Attending: Neurosurgery | Admitting: Neurosurgery

## 2018-08-15 ENCOUNTER — Encounter (HOSPITAL_COMMUNITY)
Admission: RE | Admit: 2018-08-15 | Discharge: 2018-08-15 | Disposition: A | Discharge status: Home / Self Care | Payer: Medicare Other | Source: Ambulatory Visit | Attending: Neurosurgery | Admitting: Neurosurgery

## 2018-08-15 ENCOUNTER — Encounter (HOSPITAL_COMMUNITY): Payer: Self-pay

## 2018-08-15 ENCOUNTER — Other Ambulatory Visit: Payer: Self-pay

## 2018-08-15 DIAGNOSIS — E039 Hypothyroidism, unspecified: Secondary | ICD-10-CM | POA: Diagnosis not present

## 2018-08-15 DIAGNOSIS — I129 Hypertensive chronic kidney disease with stage 1 through stage 4 chronic kidney disease, or unspecified chronic kidney disease: Secondary | ICD-10-CM | POA: Diagnosis not present

## 2018-08-15 DIAGNOSIS — M79605 Pain in left leg: Secondary | ICD-10-CM | POA: Diagnosis present

## 2018-08-15 DIAGNOSIS — Z20828 Contact with and (suspected) exposure to other viral communicable diseases: Secondary | ICD-10-CM | POA: Insufficient documentation

## 2018-08-15 DIAGNOSIS — M5126 Other intervertebral disc displacement, lumbar region: Secondary | ICD-10-CM | POA: Insufficient documentation

## 2018-08-15 DIAGNOSIS — M5116 Intervertebral disc disorders with radiculopathy, lumbar region: Secondary | ICD-10-CM | POA: Diagnosis not present

## 2018-08-15 DIAGNOSIS — N183 Chronic kidney disease, stage 3 (moderate): Secondary | ICD-10-CM | POA: Diagnosis not present

## 2018-08-15 DIAGNOSIS — E1122 Type 2 diabetes mellitus with diabetic chronic kidney disease: Secondary | ICD-10-CM | POA: Diagnosis not present

## 2018-08-15 DIAGNOSIS — Z7984 Long term (current) use of oral hypoglycemic drugs: Secondary | ICD-10-CM | POA: Diagnosis not present

## 2018-08-15 DIAGNOSIS — F329 Major depressive disorder, single episode, unspecified: Secondary | ICD-10-CM | POA: Diagnosis not present

## 2018-08-15 DIAGNOSIS — Z01812 Encounter for preprocedural laboratory examination: Secondary | ICD-10-CM | POA: Insufficient documentation

## 2018-08-15 DIAGNOSIS — Z79899 Other long term (current) drug therapy: Secondary | ICD-10-CM | POA: Diagnosis not present

## 2018-08-15 DIAGNOSIS — Z79890 Hormone replacement therapy (postmenopausal): Secondary | ICD-10-CM | POA: Diagnosis not present

## 2018-08-15 LAB — BASIC METABOLIC PANEL
Anion gap: 13 (ref 5–15)
BUN: 13 mg/dL (ref 8–23)
CO2: 22 mmol/L (ref 22–32)
Calcium: 9.9 mg/dL (ref 8.9–10.3)
Chloride: 102 mmol/L (ref 98–111)
Creatinine, Ser: 1.19 mg/dL — ABNORMAL HIGH (ref 0.44–1.00)
GFR calc Af Amer: 47 mL/min — ABNORMAL LOW (ref >60)
GFR calc non Af Amer: 40 mL/min — ABNORMAL LOW (ref >60)
Glucose, Bld: 98 mg/dL (ref 70–99)
Potassium: 4.5 mmol/L (ref 3.5–5.1)
Sodium: 137 mmol/L (ref 135–145)

## 2018-08-15 LAB — CBC WITH DIFFERENTIAL/PLATELET
Abs Immature Granulocytes: 0.04 10*3/uL (ref 0.00–0.07)
Basophils Absolute: 0.1 10*3/uL (ref 0.0–0.1)
Basophils Relative: 1 %
Eosinophils Absolute: 0.1 10*3/uL (ref 0.0–0.5)
Eosinophils Relative: 1 %
HCT: 47.4 % — ABNORMAL HIGH (ref 36.0–46.0)
Hemoglobin: 14.4 g/dL (ref 12.0–15.0)
Immature Granulocytes: 1 %
Lymphocytes Relative: 29 %
Lymphs Abs: 2.1 10*3/uL (ref 0.7–4.0)
MCH: 28.5 pg (ref 26.0–34.0)
MCHC: 30.4 g/dL (ref 30.0–36.0)
MCV: 93.7 fL (ref 80.0–100.0)
Monocytes Absolute: 0.4 10*3/uL (ref 0.1–1.0)
Monocytes Relative: 6 %
Neutro Abs: 4.7 10*3/uL (ref 1.7–7.7)
Neutrophils Relative %: 62 %
Platelets: 202 10*3/uL (ref 150–400)
RBC: 5.06 MIL/uL (ref 3.87–5.11)
RDW: 14.2 % (ref 11.5–15.5)
WBC: 7.3 10*3/uL (ref 4.0–10.5)
nRBC: 0 % (ref 0.0–0.2)

## 2018-08-15 LAB — SURGICAL PCR SCREEN
MRSA, PCR: NEGATIVE
Staphylococcus aureus: NEGATIVE

## 2018-08-15 LAB — SARS CORONAVIRUS 2 (TAT 6-24 HRS): SARS Coronavirus 2: NEGATIVE

## 2018-08-15 LAB — HEMOGLOBIN A1C
Hgb A1c MFr Bld: 6.6 % — ABNORMAL HIGH (ref 4.8–5.6)
Mean Plasma Glucose: 142.72 mg/dL

## 2018-08-15 LAB — GLUCOSE, CAPILLARY: Glucose-Capillary: 105 mg/dL — ABNORMAL HIGH (ref 70–99)

## 2018-08-15 NOTE — Progress Notes (Addendum)
PCP - Lajean Manes, MD Cardiologist - pt denies  Chest x-ray -  Pt denies past year, no recent respiratory infections/complications EKG - 79/02/4095 in EPIC  Stress Test - denies ECHO - denies  Cardiac Cath - denies  Sleep Study - n/a CPAP - n/a  Fasting Blood Sugar - 90s  Checks Blood Sugar 1 times a day  Blood Thinner Instructions: n/a Aspirin Instructions: n/a  Anesthesia review: Yes-EKG  Patient denies shortness of breath, fever, cough and chest pain at PAT appointment  Patient verbalized understanding of instructions that were given to them at the PAT appointment. Patient was also instructed that they will need to review over the PAT instructions again at home before surgery.   Coronavirus Screening  Have you experienced the following symptoms:  Cough yes/no: No Fever (>100.37F)  yes/no: No Runny nose yes/no: No Sore throat yes/no: No Difficulty breathing/shortness of breath  yes/no: No  Have you or a family member traveled in the last 14 days and where? yes/no: No   If the patient indicates "YES" to the above questions, their PAT will be rescheduled to limit the exposure to others and, the surgeon will be notified. THE PATIENT WILL NEED TO BE ASYMPTOMATIC FOR 14 DAYS.   If the patient is not experiencing any of these symptoms, the PAT nurse will instruct them to NOT bring anyone with them to their appointment since they may have these symptoms or traveled as well.   Please remind your patients and families that hospital visitation restrictions are in effect and the importance of the restrictions.

## 2018-08-17 MED ORDER — CEFAZOLIN SODIUM-DEXTROSE 2-4 GM/100ML-% IV SOLN
2.0000 g | INTRAVENOUS | Status: AC
  Administered 2018-08-18: 2 g via INTRAVENOUS
  Filled 2018-08-17: qty 100

## 2018-08-18 ENCOUNTER — Other Ambulatory Visit: Payer: Self-pay

## 2018-08-18 ENCOUNTER — Encounter (HOSPITAL_COMMUNITY)
Admission: RE | Disposition: A | Discharge status: Home / Self Care | Payer: Self-pay | Source: Home / Self Care | Attending: Neurosurgery

## 2018-08-18 ENCOUNTER — Observation Stay (HOSPITAL_COMMUNITY)
Admission: RE | Admit: 2018-08-18 | Discharge: 2018-08-19 | Disposition: A | Discharge status: Home / Self Care | Payer: Medicare Other | Attending: Neurosurgery | Admitting: Neurosurgery

## 2018-08-18 ENCOUNTER — Encounter (HOSPITAL_COMMUNITY): Payer: Self-pay | Admitting: *Deleted

## 2018-08-18 ENCOUNTER — Ambulatory Visit (HOSPITAL_COMMUNITY): Payer: Medicare Other | Admitting: Anesthesiology

## 2018-08-18 ENCOUNTER — Ambulatory Visit (HOSPITAL_COMMUNITY): Payer: Medicare Other | Admitting: Physician Assistant

## 2018-08-18 ENCOUNTER — Ambulatory Visit (HOSPITAL_COMMUNITY): Payer: Medicare Other

## 2018-08-18 DIAGNOSIS — E039 Hypothyroidism, unspecified: Secondary | ICD-10-CM | POA: Insufficient documentation

## 2018-08-18 DIAGNOSIS — M5416 Radiculopathy, lumbar region: Secondary | ICD-10-CM | POA: Diagnosis present

## 2018-08-18 DIAGNOSIS — N183 Chronic kidney disease, stage 3 (moderate): Secondary | ICD-10-CM | POA: Insufficient documentation

## 2018-08-18 DIAGNOSIS — Z79899 Other long term (current) drug therapy: Secondary | ICD-10-CM | POA: Insufficient documentation

## 2018-08-18 DIAGNOSIS — M5116 Intervertebral disc disorders with radiculopathy, lumbar region: Secondary | ICD-10-CM | POA: Diagnosis not present

## 2018-08-18 DIAGNOSIS — E1122 Type 2 diabetes mellitus with diabetic chronic kidney disease: Secondary | ICD-10-CM | POA: Diagnosis not present

## 2018-08-18 DIAGNOSIS — I129 Hypertensive chronic kidney disease with stage 1 through stage 4 chronic kidney disease, or unspecified chronic kidney disease: Secondary | ICD-10-CM | POA: Insufficient documentation

## 2018-08-18 DIAGNOSIS — Z7984 Long term (current) use of oral hypoglycemic drugs: Secondary | ICD-10-CM | POA: Insufficient documentation

## 2018-08-18 DIAGNOSIS — Z20828 Contact with and (suspected) exposure to other viral communicable diseases: Secondary | ICD-10-CM | POA: Insufficient documentation

## 2018-08-18 DIAGNOSIS — Z419 Encounter for procedure for purposes other than remedying health state, unspecified: Secondary | ICD-10-CM

## 2018-08-18 DIAGNOSIS — F329 Major depressive disorder, single episode, unspecified: Secondary | ICD-10-CM | POA: Insufficient documentation

## 2018-08-18 DIAGNOSIS — Z79890 Hormone replacement therapy (postmenopausal): Secondary | ICD-10-CM | POA: Insufficient documentation

## 2018-08-18 HISTORY — PX: LUMBAR LAMINECTOMY/DECOMPRESSION MICRODISCECTOMY: SHX5026

## 2018-08-18 LAB — GLUCOSE, CAPILLARY
Glucose-Capillary: 141 mg/dL — ABNORMAL HIGH (ref 70–99)
Glucose-Capillary: 116 mg/dL — ABNORMAL HIGH (ref 70–99)
Glucose-Capillary: 237 mg/dL — ABNORMAL HIGH (ref 70–99)
Glucose-Capillary: 348 mg/dL — ABNORMAL HIGH (ref 70–99)

## 2018-08-18 SURGERY — LUMBAR LAMINECTOMY/DECOMPRESSION MICRODISCECTOMY 1 LEVEL
Anesthesia: General | Site: Back | Laterality: Left

## 2018-08-18 MED ORDER — ONDANSETRON HCL 4 MG/2ML IJ SOLN: 4.0000 mg | Freq: Once | INTRAMUSCULAR | Status: DC | PRN

## 2018-08-18 MED ORDER — ACETAMINOPHEN 325 MG PO TABS: 325.0000 mg | ORAL_TABLET | ORAL | Status: DC | PRN

## 2018-08-18 MED ORDER — CHLORHEXIDINE GLUCONATE CLOTH 2 % EX PADS: 6.0000 | MEDICATED_PAD | Freq: Once | CUTANEOUS | Status: DC

## 2018-08-18 MED ORDER — ACETAMINOPHEN 160 MG/5ML PO SOLN: 325.0000 mg | ORAL | Status: DC | PRN

## 2018-08-18 MED ORDER — ONDANSETRON HCL 4 MG/2ML IJ SOLN: 4.0000 mg | Freq: Four times a day (QID) | INTRAMUSCULAR | Status: DC | PRN

## 2018-08-18 MED ORDER — SODIUM CHLORIDE 0.9 % IV SOLN
INTRAVENOUS | Status: DC | PRN
  Administered 2018-08-18: 11:00:00 500 mL

## 2018-08-18 MED ORDER — AMLODIPINE BESYLATE 5 MG PO TABS: 5.0000 mg | ORAL_TABLET | Freq: Every day | ORAL | Status: DC

## 2018-08-18 MED ORDER — TRIAMTERENE-HCTZ 37.5-25 MG PO CAPS
1.0000 | ORAL_CAPSULE | Freq: Every day | ORAL | Status: DC
  Filled 2018-08-18: qty 1

## 2018-08-18 MED ORDER — GLIMEPIRIDE 2 MG PO TABS
2.0000 mg | ORAL_TABLET | Freq: Every day | ORAL | Status: DC
  Administered 2018-08-19: 2 mg via ORAL
  Filled 2018-08-18: qty 1

## 2018-08-18 MED ORDER — MEPERIDINE HCL 25 MG/ML IJ SOLN: 6.2500 mg | INTRAMUSCULAR | Status: DC | PRN

## 2018-08-18 MED ORDER — BUPIVACAINE-EPINEPHRINE (PF) 0.25% -1:200000 IJ SOLN
INTRAMUSCULAR | Status: AC
  Filled 2018-08-18: qty 30

## 2018-08-18 MED ORDER — MENTHOL 3 MG MT LOZG: 1.0000 | LOZENGE | OROMUCOSAL | Status: DC | PRN

## 2018-08-18 MED ORDER — FENTANYL CITRATE (PF) 100 MCG/2ML IJ SOLN
INTRAMUSCULAR | Status: AC
  Filled 2018-08-18: qty 2

## 2018-08-18 MED ORDER — INSULIN ASPART 100 UNIT/ML ~~LOC~~ SOLN
0.0000 [IU] | Freq: Every day | SUBCUTANEOUS | Status: DC
  Administered 2018-08-18: 22:00:00 4 [IU] via SUBCUTANEOUS

## 2018-08-18 MED ORDER — BUPIVACAINE HCL (PF) 0.25 % IJ SOLN
INTRAMUSCULAR | Status: DC | PRN
  Administered 2018-08-18: 20 mL

## 2018-08-18 MED ORDER — HEMOSTATIC AGENTS (NO CHARGE) OPTIME
TOPICAL | Status: DC | PRN
  Administered 2018-08-18: 1 via TOPICAL

## 2018-08-18 MED ORDER — SODIUM CHLORIDE 0.9% FLUSH
3.0000 mL | Freq: Two times a day (BID) | INTRAVENOUS | Status: DC
  Administered 2018-08-18: 3 mL via INTRAVENOUS

## 2018-08-18 MED ORDER — SODIUM CHLORIDE 0.9 % IV SOLN: 250.0000 mL | INTRAVENOUS | Status: DC

## 2018-08-18 MED ORDER — DEXAMETHASONE SODIUM PHOSPHATE 10 MG/ML IJ SOLN
INTRAMUSCULAR | Status: DC | PRN
  Administered 2018-08-18: 10 mg via INTRAVENOUS

## 2018-08-18 MED ORDER — CEFAZOLIN SODIUM-DEXTROSE 1-4 GM/50ML-% IV SOLN
1.0000 g | Freq: Three times a day (TID) | INTRAVENOUS | Status: DC
  Administered 2018-08-18: 1 g via INTRAVENOUS
  Filled 2018-08-18: qty 50

## 2018-08-18 MED ORDER — THROMBIN 5000 UNITS EX SOLR
CUTANEOUS | Status: AC
  Filled 2018-08-18: qty 10000

## 2018-08-18 MED ORDER — THROMBIN 5000 UNITS EX SOLR
CUTANEOUS | Status: DC | PRN
  Administered 2018-08-18 (×2): 5000 [IU] via TOPICAL

## 2018-08-18 MED ORDER — ACETAMINOPHEN 325 MG PO TABS: 650.0000 mg | ORAL_TABLET | ORAL | Status: DC | PRN

## 2018-08-18 MED ORDER — ONDANSETRON HCL 4 MG PO TABS: 4.0000 mg | ORAL_TABLET | Freq: Four times a day (QID) | ORAL | Status: DC | PRN

## 2018-08-18 MED ORDER — HYDROCODONE-ACETAMINOPHEN 10-325 MG PO TABS
1.0000 | ORAL_TABLET | ORAL | Status: DC | PRN
  Administered 2018-08-19: 1 via ORAL
  Filled 2018-08-18: qty 1

## 2018-08-18 MED ORDER — HYDROCODONE-ACETAMINOPHEN 5-325 MG PO TABS
1.0000 | ORAL_TABLET | ORAL | Status: DC | PRN
  Administered 2018-08-18 – 2018-08-19 (×3): 1 via ORAL
  Filled 2018-08-18 (×3): qty 1

## 2018-08-18 MED ORDER — LEVOTHYROXINE SODIUM 137 MCG PO TABS
137.0000 ug | ORAL_TABLET | Freq: Every day | ORAL | Status: DC
  Administered 2018-08-19: 06:00:00 137 ug via ORAL
  Filled 2018-08-18: qty 1

## 2018-08-18 MED ORDER — SODIUM CHLORIDE 0.9% FLUSH: 3.0000 mL | INTRAVENOUS | Status: DC | PRN

## 2018-08-18 MED ORDER — FENTANYL CITRATE (PF) 250 MCG/5ML IJ SOLN
INTRAMUSCULAR | Status: AC
  Filled 2018-08-18: qty 5

## 2018-08-18 MED ORDER — ONDANSETRON HCL 4 MG/2ML IJ SOLN
INTRAMUSCULAR | Status: DC | PRN
  Administered 2018-08-18: 4 mg via INTRAVENOUS

## 2018-08-18 MED ORDER — BUPIVACAINE HCL (PF) 0.25 % IJ SOLN
INTRAMUSCULAR | Status: AC
  Filled 2018-08-18: qty 30

## 2018-08-18 MED ORDER — PROPOFOL 10 MG/ML IV BOLUS
INTRAVENOUS | Status: DC | PRN
  Administered 2018-08-18: 100 mg via INTRAVENOUS

## 2018-08-18 MED ORDER — GABAPENTIN 300 MG PO CAPS
300.0000 mg | ORAL_CAPSULE | Freq: Two times a day (BID) | ORAL | Status: DC
  Administered 2018-08-18 – 2018-08-19 (×2): 300 mg via ORAL
  Filled 2018-08-18 (×2): qty 1

## 2018-08-18 MED ORDER — PHENOL 1.4 % MT LIQD: 1.0000 | OROMUCOSAL | Status: DC | PRN

## 2018-08-18 MED ORDER — KETOROLAC TROMETHAMINE 30 MG/ML IJ SOLN
INTRAMUSCULAR | Status: DC | PRN
  Administered 2018-08-18: 15 mg via INTRAVENOUS

## 2018-08-18 MED ORDER — HYDROMORPHONE HCL 1 MG/ML IJ SOLN: 1.0000 mg | INTRAMUSCULAR | Status: DC | PRN

## 2018-08-18 MED ORDER — 0.9 % SODIUM CHLORIDE (POUR BTL) OPTIME
TOPICAL | Status: DC | PRN
  Administered 2018-08-18: 11:00:00 1000 mL

## 2018-08-18 MED ORDER — LIDOCAINE 2% (20 MG/ML) 5 ML SYRINGE
INTRAMUSCULAR | Status: DC | PRN
  Administered 2018-08-18: 80 mg via INTRAVENOUS

## 2018-08-18 MED ORDER — OXYCODONE HCL 5 MG PO TABS: 5.0000 mg | ORAL_TABLET | Freq: Once | ORAL | Status: DC | PRN

## 2018-08-18 MED ORDER — ACETAMINOPHEN 650 MG RE SUPP: 650.0000 mg | RECTAL | Status: DC | PRN

## 2018-08-18 MED ORDER — ATENOLOL 25 MG PO TABS: 50.0000 mg | ORAL_TABLET | Freq: Every day | ORAL | Status: DC

## 2018-08-18 MED ORDER — FENTANYL CITRATE (PF) 100 MCG/2ML IJ SOLN
25.0000 ug | INTRAMUSCULAR | Status: DC | PRN
  Administered 2018-08-18: 25 ug via INTRAVENOUS

## 2018-08-18 MED ORDER — FENTANYL CITRATE (PF) 250 MCG/5ML IJ SOLN
INTRAMUSCULAR | Status: DC | PRN
  Administered 2018-08-18 (×3): 50 ug via INTRAVENOUS

## 2018-08-18 MED ORDER — KETOROLAC TROMETHAMINE 15 MG/ML IJ SOLN
7.5000 mg | Freq: Four times a day (QID) | INTRAMUSCULAR | Status: DC
  Administered 2018-08-18: 18:00:00 7.5 mg via INTRAVENOUS
  Filled 2018-08-18: qty 1

## 2018-08-18 MED ORDER — DEXAMETHASONE SODIUM PHOSPHATE 10 MG/ML IJ SOLN
10.0000 mg | Freq: Once | INTRAMUSCULAR | Status: DC
  Filled 2018-08-18: qty 1

## 2018-08-18 MED ORDER — LORAZEPAM 0.5 MG PO TABS
1.0000 mg | ORAL_TABLET | Freq: Two times a day (BID) | ORAL | Status: DC
  Administered 2018-08-18: 1 mg via ORAL
  Filled 2018-08-18: qty 2

## 2018-08-18 MED ORDER — ROCURONIUM BROMIDE 10 MG/ML (PF) SYRINGE
PREFILLED_SYRINGE | INTRAVENOUS | Status: DC | PRN
  Administered 2018-08-18: 50 mg via INTRAVENOUS

## 2018-08-18 MED ORDER — INSULIN ASPART 100 UNIT/ML ~~LOC~~ SOLN: 0.0000 [IU] | Freq: Three times a day (TID) | SUBCUTANEOUS | Status: DC

## 2018-08-18 MED ORDER — OXYCODONE HCL 5 MG/5ML PO SOLN: 5.0000 mg | Freq: Once | ORAL | Status: DC | PRN

## 2018-08-18 MED ORDER — SUGAMMADEX SODIUM 200 MG/2ML IV SOLN
INTRAVENOUS | Status: DC | PRN
  Administered 2018-08-18: 322 mg via INTRAVENOUS

## 2018-08-18 MED ORDER — TRAZODONE HCL 50 MG PO TABS
50.0000 mg | ORAL_TABLET | Freq: Every day | ORAL | Status: DC
  Filled 2018-08-18 (×2): qty 1

## 2018-08-18 MED ORDER — SERTRALINE HCL 50 MG PO TABS: 50.0000 mg | ORAL_TABLET | Freq: Every day | ORAL | Status: DC

## 2018-08-18 MED ORDER — CYCLOBENZAPRINE HCL 5 MG PO TABS: 5.0000 mg | ORAL_TABLET | Freq: Three times a day (TID) | ORAL | Status: DC | PRN

## 2018-08-18 MED ORDER — CYCLOBENZAPRINE HCL 10 MG PO TABS: 10.0000 mg | ORAL_TABLET | Freq: Three times a day (TID) | ORAL | Status: DC | PRN

## 2018-08-18 MED ORDER — SODIUM CHLORIDE 0.9 % IV SOLN
INTRAVENOUS | Status: DC | PRN
  Administered 2018-08-18: 15 ug/min via INTRAVENOUS

## 2018-08-18 MED ORDER — HYDROCODONE-ACETAMINOPHEN 10-325 MG PO TABS: 2.0000 | ORAL_TABLET | ORAL | Status: DC | PRN

## 2018-08-18 MED ORDER — LACTATED RINGERS IV SOLN
INTRAVENOUS | Status: DC
  Administered 2018-08-18 (×2): via INTRAVENOUS

## 2018-08-18 SURGICAL SUPPLY — 45 items
BAG DECANTER FOR FLEXI CONT (MISCELLANEOUS) ×3 IMPLANT
BENZOIN TINCTURE PRP APPL 2/3 (GAUZE/BANDAGES/DRESSINGS) ×3 IMPLANT
BLADE CLIPPER SURG (BLADE) IMPLANT
BUR CUTTER 7.0 ROUND (BURR) ×3 IMPLANT
CANISTER SUCT 3000ML PPV (MISCELLANEOUS) ×3 IMPLANT
CARTRIDGE OIL MAESTRO DRILL (MISCELLANEOUS) ×1 IMPLANT
CLOSURE WOUND 1/2 X4 (GAUZE/BANDAGES/DRESSINGS) ×1
COVER WAND RF STERILE (DRAPES) ×3 IMPLANT
DECANTER SPIKE VIAL GLASS SM (MISCELLANEOUS) ×3 IMPLANT
DERMABOND ADVANCED (GAUZE/BANDAGES/DRESSINGS) ×2
DERMABOND ADVANCED .7 DNX12 (GAUZE/BANDAGES/DRESSINGS) ×1 IMPLANT
DIFFUSER DRILL AIR PNEUMATIC (MISCELLANEOUS) ×3 IMPLANT
DRAPE HALF SHEET 40X57 (DRAPES) IMPLANT
DRAPE LAPAROTOMY 100X72X124 (DRAPES) ×3 IMPLANT
DRAPE MICROSCOPE LEICA (MISCELLANEOUS) ×3 IMPLANT
DRAPE SURG 17X23 STRL (DRAPES) ×6 IMPLANT
DRSG OPSITE POSTOP 3X4 (GAUZE/BANDAGES/DRESSINGS) ×3 IMPLANT
DURAPREP 26ML APPLICATOR (WOUND CARE) ×3 IMPLANT
ELECT REM PT RETURN 9FT ADLT (ELECTROSURGICAL) ×3
ELECTRODE REM PT RTRN 9FT ADLT (ELECTROSURGICAL) ×1 IMPLANT
GAUZE 4X4 16PLY RFD (DISPOSABLE) IMPLANT
GAUZE SPONGE 4X4 12PLY STRL (GAUZE/BANDAGES/DRESSINGS) ×3 IMPLANT
GLOVE ECLIPSE 9.0 STRL (GLOVE) ×3 IMPLANT
GLOVE EXAM NITRILE XL STR (GLOVE) IMPLANT
GLOVE SURG SS PI 7.5 STRL IVOR (GLOVE) ×3 IMPLANT
GOWN STRL REUS W/ TWL LRG LVL3 (GOWN DISPOSABLE) ×1 IMPLANT
GOWN STRL REUS W/ TWL XL LVL3 (GOWN DISPOSABLE) ×2 IMPLANT
GOWN STRL REUS W/TWL 2XL LVL3 (GOWN DISPOSABLE) IMPLANT
GOWN STRL REUS W/TWL LRG LVL3 (GOWN DISPOSABLE) ×2
GOWN STRL REUS W/TWL XL LVL3 (GOWN DISPOSABLE) ×4
KIT BASIN OR (CUSTOM PROCEDURE TRAY) ×3 IMPLANT
KIT TURNOVER KIT B (KITS) ×3 IMPLANT
NEEDLE HYPO 22GX1.5 SAFETY (NEEDLE) ×3 IMPLANT
NEEDLE SPNL 22GX3.5 QUINCKE BK (NEEDLE) ×3 IMPLANT
NS IRRIG 1000ML POUR BTL (IV SOLUTION) ×3 IMPLANT
OIL CARTRIDGE MAESTRO DRILL (MISCELLANEOUS) ×3
PACK LAMINECTOMY NEURO (CUSTOM PROCEDURE TRAY) ×3 IMPLANT
RUBBERBAND STERILE (MISCELLANEOUS) ×6 IMPLANT
SPONGE SURGIFOAM ABS GEL SZ50 (HEMOSTASIS) ×3 IMPLANT
STRIP CLOSURE SKIN 1/2X4 (GAUZE/BANDAGES/DRESSINGS) ×2 IMPLANT
SUT VIC AB 2-0 CT1 18 (SUTURE) ×3 IMPLANT
SUT VIC AB 3-0 SH 8-18 (SUTURE) ×3 IMPLANT
TOWEL GREEN STERILE (TOWEL DISPOSABLE) ×3 IMPLANT
TOWEL GREEN STERILE FF (TOWEL DISPOSABLE) ×3 IMPLANT
WATER STERILE IRR 1000ML POUR (IV SOLUTION) ×3 IMPLANT

## 2018-08-18 NOTE — Anesthesia Preprocedure Evaluation (Signed)
Anesthesia Evaluation  Patient identified by MRN, date of birth, ID band Patient awake    Reviewed: Allergy & Precautions, NPO status , Patient's Chart, lab work & pertinent test results, reviewed documented beta blocker date and time   Airway Mallampati: II  TM Distance: >3 FB Neck ROM: Full    Dental no notable dental hx.    Pulmonary neg pulmonary ROS,    Pulmonary exam normal breath sounds clear to auscultation       Cardiovascular hypertension, Pt. on medications and Pt. on home beta blockers negative cardio ROS Normal cardiovascular exam Rhythm:Regular Rate:Normal     Neuro/Psych  Headaches, PSYCHIATRIC DISORDERS Depression    GI/Hepatic negative GI ROS, Neg liver ROS,   Endo/Other  diabetes, Type 2, Oral Hypoglycemic AgentsHypothyroidism   Renal/GU CRFRenal disease  negative genitourinary   Musculoskeletal  (+) Arthritis , Osteoarthritis,    Abdominal (+) + obese,   Peds negative pediatric ROS (+)  Hematology negative hematology ROS (+)   Anesthesia Other Findings   Reproductive/Obstetrics negative OB ROS                             Anesthesia Physical  Anesthesia Plan  ASA: III  Anesthesia Plan: General   Post-op Pain Management:    Induction: Intravenous  PONV Risk Score and Plan: 3 and Ondansetron and Treatment may vary due to age or medical condition  Airway Management Planned: Oral ETT  Additional Equipment:   Intra-op Plan:   Post-operative Plan: Extubation in OR  Informed Consent: I have reviewed the patients History and Physical, chart, labs and discussed the procedure including the risks, benefits and alternatives for the proposed anesthesia with the patient or authorized representative who has indicated his/her understanding and acceptance.     Dental advisory given  Plan Discussed with: CRNA, Anesthesiologist and Surgeon  Anesthesia Plan Comments:  (  )        Anesthesia Quick Evaluation

## 2018-08-18 NOTE — Anesthesia Procedure Notes (Signed)
Procedure Name: Intubation Date/Time: 08/18/2018 11:36 AM Performed by: Clearnce Sorrel, CRNA Pre-anesthesia Checklist: Patient identified, Emergency Drugs available and Patient being monitored Patient Re-evaluated:Patient Re-evaluated prior to induction Oxygen Delivery Method: Circle system utilized Preoxygenation: Pre-oxygenation with 100% oxygen Induction Type: IV induction Ventilation: Mask ventilation without difficulty Laryngoscope Size: Mac and 3 Grade View: Grade I Tube type: Oral Tube size: 7.0 mm Number of attempts: 1 Airway Equipment and Method: Stylet Placement Confirmation: ETT inserted through vocal cords under direct vision,  positive ETCO2 and breath sounds checked- equal and bilateral Secured at: 21 cm Tube secured with: Tape Dental Injury: Teeth and Oropharynx as per pre-operative assessment

## 2018-08-18 NOTE — H&P (Signed)
Kelly Barker is an 85 y.o. female.   Chief Complaint: Left leg pain HPI: 85 year old female with intractable left lower extremity pain failing conservative management.  Work-up demonstrates evidence for significant left-sided L4-5 disc herniation with caudal migration and marked compression of the left L5 nerve root.  Patient presents now for laminotomy and microdiscectomy in hopes of improving her symptoms.  Past Medical History:  Diagnosis Date  . Alopecia (capitis) totalis   . Chronic back pain   . Chronic headaches    migraines until menopause none since  . Chronic renal disease, stage 3, moderately decreased glomerular filtration rate between 30-59 mL/min/1.73 square meter (HCC)   . Degenerative joint disease   . Depression   . Diabetes mellitus due to underlying condition with diabetic chronic kidney disease ([place])    type 2  . Diverticulitis 08/2016  . Diverticulosis   . Dyspnea    with exertion  . Hematuria, microscopic   . Hyperlipidemia   . Hypertension   . Hypothyroidism   . Osteopenia   . Thrombocytopenia ([place])   . Vitamin D deficiency     Past Surgical History:  Procedure Laterality Date  . bladder stretching surgery  yrs ago   x 2  . CATARACT EXTRACTION, BILATERAL  6/18, 8/18  . CYSTOSCOPY WITH URETHRAL DILATATION N/A 11/29/2017   Procedure: CYSTOSCOPY WITH URETHRAL DILATATION;  Surgeon: Cleon Gustin, MD;  Location: [facility];  Service: Urology;  Laterality: N/A;  30 MINS    Family History  Problem Relation Age of Onset  . Uterine cancer Mother   . Bladder Cancer Father   . Dementia Sister    Social History:  reports that she has never smoked. She has never used smokeless tobacco. She reports that she does not drink alcohol or use drugs.  Allergies:  Allergies  Allergen Reactions  . Crestor [Rosuvastatin] Other (See Comments)    Muscle cramps  . Lisinopril Other (See Comments)    Decreased GFR.  Marland Kitchen Pravachol [Pravastatin  Sodium] Other (See Comments)    Leg cramps.  . Simvastatin Other (See Comments)    Leg cramps  . Tolectin [Tolmetin] Swelling    Medications Prior to Admission  Medication Sig Dispense Refill  . acetaminophen (TYLENOL) 325 MG tablet Take 325 mg by mouth every 6 (six) hours as needed for mild pain.    Marland Kitchen amLODipine (NORVASC) 5 MG tablet Take 5 mg by mouth daily.    Marland Kitchen atenolol (TENORMIN) 50 MG tablet Take 50 mg by mouth daily.    Marland Kitchen gabapentin (NEURONTIN) 100 MG capsule Take 300 mg by mouth 2 (two) times a day.    Marland Kitchen glimepiride (AMARYL) 2 MG tablet Take 2 mg by mouth daily. With main meal daily    . levothyroxine (SYNTHROID, LEVOTHROID) 137 MCG tablet Take 137 mcg by mouth daily before breakfast.     . LORazepam (ATIVAN) 1 MG tablet Take 1 mg by mouth 2 (two) times daily. Take one tablet twice daily.    . sertraline (ZOLOFT) 50 MG tablet Take 50 mg by mouth daily.    . traZODone (DESYREL) 50 MG tablet Take 50 mg by mouth at bedtime.    . triamterene-hydrochlorothiazide (DYAZIDE) 37.5-25 MG per capsule Take 1 capsule by mouth every morning.    . diclofenac sodium (VOLTAREN) 1 % GEL Apply 4 g topically 4 (four) times daily. (Patient not taking: Reported on 08/15/2018) 100 g 0  . oxyCODONE (ROXICODONE) 5 MG immediate release tablet Take  1 tablet (5 mg total) by mouth every 12 (twelve) hours as needed for severe pain. (Patient not taking: Reported on 08/15/2018) 15 tablet 0  . oxyCODONE-acetaminophen (PERCOCET/ROXICET) 5-325 MG tablet Take 2 tablets by mouth every 4 (four) hours as needed for severe pain. (Patient not taking: Reported on 08/15/2018) 10 tablet 0  . predniSONE (STERAPRED UNI-PAK 21 TAB) 10 MG (21) TBPK tablet Take 6 tabs for 2 days, then 5 for 2 days, then 4 for 2 days, then 3 for 2 days, 2 for 2 days, then 1 for 2 days (Patient not taking: Reported on 08/15/2018) 42 tablet 0    Results for orders placed or performed during the hospital encounter of 08/18/18 (from the past 48 hour(s))   Glucose, capillary     Status: Abnormal   Collection Time: 08/18/18  8:45 AM  Result Value Ref Range   Glucose-Capillary 141 (H) 70 - 99 mg/dL  Glucose, capillary     Status: Abnormal   Collection Time: 08/18/18 10:51 AM  Result Value Ref Range   Glucose-Capillary 116 (H) 70 - 99 mg/dL   No results found.  Pertinent items noted in HPI and remainder of comprehensive ROS otherwise negative.  Blood pressure (!) 158/50, pulse 72, temperature 98.2 F (36.8 C), temperature source Oral, resp. rate 18, SpO2 98 %.  Patient is awake and alert.  She is oriented and appropriate.  Speech is fluent.  Judgment insight are intact.  Cranial nerve function normal bilateral.  Motor and sensory function of the extremities normal except left extensor hallucis longus 4-/5 and left anterior tibialis 4/5.  Sensory examination with decrease sensation pinprick light touch in her left L5 dermatome.  Deep tendon flexes normal active except her Achilles reflexes are absent bilaterally.  Gait is antalgic.  Posture is somewhat flexed.  Examination head ears eyes nose throat is unremarked.  Chest and abdomen are benign.  Extremities are free from injury or deformity. Assessment/Plan Left L4-5 herniated nucleus pulposus with radiculopathy.  Plan left L4-5 laminotomy and microdiscectomy.  Risks and benefits have been explained.  Patient wishes to proceed.  Sherilyn CooterHenry A Dorris Vangorder 08/18/2018, 10:59 AM

## 2018-08-18 NOTE — Anesthesia Postprocedure Evaluation (Signed)
Anesthesia Post Note  Patient: Kelly Barker  Procedure(s) Performed: Microdiscectomy - left - Lumbar four-lumbar five (Left Back)     Patient location during evaluation: PACU Anesthesia Type: General Level of consciousness: awake and alert Pain management: pain level controlled Vital Signs Assessment: post-procedure vital signs reviewed and stable Respiratory status: spontaneous breathing, nonlabored ventilation, respiratory function stable and patient connected to nasal cannula oxygen Cardiovascular status: blood pressure returned to baseline and stable Postop Assessment: no apparent nausea or vomiting Anesthetic complications: no    Last Vitals:  Vitals:   08/18/18 1400 08/18/18 1409  BP:  (!) 138/54  Pulse: 60 68  Resp: 15 15  Temp: 36.5 C   SpO2: 100% 100%    Last Pain:  Vitals:   08/18/18 1330  TempSrc:   PainSc: 10-Worst pain ever                 Gustie Bobb

## 2018-08-18 NOTE — Op Note (Signed)
Date of procedure: 08/18/2018  Date of dictation: Same  Service: Neurosurgery  Preoperative diagnosis: Left L4-5 herniated nucleus pulposus with radiculopathy  Postoperative diagnosis: Same  Procedure Name: Left L4-5 laminotomy and microdiscectomy  Surgeon:Cambrey Lupi A.Anesa Fronek, M.D.  Asst. Surgeon: Reinaldo Meeker, NP  Anesthesia: General  Indication: 85 year old white female with severe back and left lower extremity radicular pain failing conservative management.  Work-up demonstrates evidence of a moderately large left-sided L4-5 disc herniation with caudal migration and marked compression of her left L5 nerve root.  Patient presents now for microdiscectomy in hopes of improving her symptoms.  Operative note: After induction anesthesia, patient position prone on the Wilson frame and appropriately padded.  Lumbar region prepped and draped sterilely.  Incision made overlying L4-5.  Dissection performed on the left.  Retractor placed.  X-ray taken.  Level confirmed.  Laminotomy performed using high-speed drill and Kerrison Rogers.  Ligament flavum elevated and resected.  Underlying thecal sac and left L5 nerve root identified.  Microscope brought in field used throughout the remainder of the discectomy for microdissection of the spinal canal.  Epidural venous plexus was coagulated and cut.  Thecal sac and L5 nerve root gently mobilized and retracted towards midline.  Inferior free fragment was dissected free and removed in several large pieces.  The disc space itself was bulging but not frankly torn.  All limits the disc herniation were completely resected.  There was no evidence of any injury to thecal sac or nerve roots.  Gelfoam was placed topically for hemostasis.  Wounds and closed in layers of Vicryl sutures.  Steri-Strips and sterile dressing were applied.  No apparent complications.  Patient tolerated the procedure well and she returned to the recovery room postop.

## 2018-08-18 NOTE — Transfer of Care (Signed)
Immediate Anesthesia Transfer of Care Note  Patient: Kelly Barker November  Procedure(s) Performed: Microdiscectomy - left - Lumbar four-lumbar five (Left Back)  Patient Location: PACU  Anesthesia Type:General  Level of Consciousness: awake and alert   Airway & Oxygen Therapy: Patient Spontanous Breathing and Patient connected to face mask oxygen  Post-op Assessment: Report given to RN and Post -op Vital signs reviewed and stable  Post vital signs: Reviewed and stable  Last Vitals:  Vitals Value Taken Time  BP    Temp    Pulse    Resp    SpO2      Last Pain:  Vitals:   08/18/18 0902  TempSrc:   PainSc: 0-No pain         Complications: No apparent anesthesia complications

## 2018-08-18 NOTE — Brief Op Note (Signed)
08/18/2018  12:38 PM  PATIENT:  Kelly Barker November  85 y.o. female  PRE-OPERATIVE DIAGNOSIS:  Herniated Nucleus Pulposus  POST-OPERATIVE DIAGNOSIS:  Herniated Nucleus Pulposus  PROCEDURE:  Procedure(s): Microdiscectomy - left - Lumbar four-lumbar five (Left)  SURGEON:  Surgeon(s) and Role:    Earnie Larsson, MD - Primary  PHYSICIAN ASSISTANT:   ASSISTANTSMearl Latin   ANESTHESIA:   general  EBL:  75 mL   BLOOD ADMINISTERED:none  DRAINS: none   LOCAL MEDICATIONS USED:  MARCAINE     SPECIMEN:  No Specimen  DISPOSITION OF SPECIMEN:  N/A  COUNTS:  YES  TOURNIQUET:  * No tourniquets in log *  DICTATION: .Dragon Dictation  PLAN OF CARE: Admit for overnight observation  PATIENT DISPOSITION:  PACU - hemodynamically stable.   Delay start of Pharmacological VTE agent (>24hrs) due to surgical blood loss or risk of bleeding: yes

## 2018-08-19 ENCOUNTER — Encounter (HOSPITAL_COMMUNITY): Payer: Self-pay | Admitting: Neurosurgery

## 2018-08-19 DIAGNOSIS — M5116 Intervertebral disc disorders with radiculopathy, lumbar region: Secondary | ICD-10-CM | POA: Diagnosis not present

## 2018-08-19 LAB — GLUCOSE, CAPILLARY: Glucose-Capillary: 97 mg/dL (ref 70–99)

## 2018-08-19 MED ORDER — HYDROCODONE-ACETAMINOPHEN 5-325 MG PO TABS: 1.0000 | ORAL_TABLET | ORAL | 0 refills | Status: DC | PRN

## 2018-08-19 MED ORDER — CYCLOBENZAPRINE HCL 5 MG PO TABS: 5.0000 mg | ORAL_TABLET | Freq: Three times a day (TID) | ORAL | 0 refills | Status: AC | PRN

## 2018-08-19 MED ORDER — TRIAMTERENE-HCTZ 37.5-25 MG PO TABS
1.0000 | ORAL_TABLET | Freq: Every day | ORAL | Status: DC
  Filled 2018-08-19: qty 1

## 2018-08-19 NOTE — Progress Notes (Signed)
Neurosurgery Service Progress Note  Subjective: No acute events overnight, no radicular symptoms post-op   Objective: Vitals:   08/18/18 1942 08/18/18 2315 08/19/18 0451 08/19/18 0721  BP: (!) 154/81 (!) 139/55 (!) 126/58 (!) 128/41  Pulse: 65 61 62 62  Resp: 18  17 18   Temp: 98.5 F (36.9 C) 97.8 F (36.6 C) 98.1 F (36.7 C) 97.9 F (36.6 C)  TempSrc: Oral Oral Oral Oral  SpO2: 95% 95%  96%   Temp (24hrs), Avg:98 F (36.7 C), Min:97.7 F (36.5 C), Max:98.5 F (36.9 C)  CBC Latest Ref Rng & Units 08/15/2018 11/10/2017 09/08/2016  WBC 4.0 - 10.5 K/uL 7.3 9.0 4.3  Hemoglobin 12.0 - 15.0 g/dL 14.4 12.9 10.4(L)  Hematocrit 36.0 - 46.0 % 47.4(H) 40.6 32.9(L)  Platelets 150 - 400 K/uL 202 PLATELET CLUMPS NOTED ON SMEAR, UNABLE TO ESTIMATE 141(L)   BMP Latest Ref Rng & Units 08/15/2018 11/10/2017 09/08/2016  Glucose 70 - 99 mg/dL 98 173(H) 108(H)  BUN 8 - 23 mg/dL 13 19 5(L)  Creatinine 0.44 - 1.00 mg/dL 1.19(H) 1.38(H) 1.06(H)  Sodium 135 - 145 mmol/L 137 137 140  Potassium 3.5 - 5.1 mmol/L 4.5 3.5 3.5  Chloride 98 - 111 mmol/L 102 102 109  CO2 22 - 32 mmol/L 22 26 25   Calcium 8.9 - 10.3 mg/dL 9.9 9.7 8.3(L)    Intake/Output Summary (Last 24 hours) at 08/19/2018 0823 Last data filed at 08/18/2018 1400 Gross per 24 hour  Intake 700 ml  Output 75 ml  Net 625 ml    Current Facility-Administered Medications:  .  0.9 %  sodium chloride infusion, 250 mL, Intravenous, Continuous, Pool, Henry, MD .  acetaminophen (TYLENOL) tablet 650 mg, 650 mg, Oral, Q4H PRN **OR** acetaminophen (TYLENOL) suppository 650 mg, 650 mg, Rectal, Q4H PRN, Pool, Mallie Mussel, MD .  amLODipine (NORVASC) tablet 5 mg, 5 mg, Oral, Daily, Pool, Henry, MD .  atenolol (TENORMIN) tablet 50 mg, 50 mg, Oral, Daily, Pool, Henry, MD .  cyclobenzaprine (FLEXERIL) tablet 5 mg, 5 mg, Oral, TID PRN, Earnie Larsson, MD .  gabapentin (NEURONTIN) capsule 300 mg, 300 mg, Oral, BID, Earnie Larsson, MD, 300 mg at 08/18/18 2155 .   glimepiride (AMARYL) tablet 2 mg, 2 mg, Oral, Q breakfast, Pool, Henry, MD .  HYDROcodone-acetaminophen (NORCO) 10-325 MG per tablet 1-2 tablet, 1-2 tablet, Oral, Q4H PRN, Earnie Larsson, MD .  HYDROcodone-acetaminophen (NORCO/VICODIN) 5-325 MG per tablet 1 tablet, 1 tablet, Oral, Q4H PRN, Earnie Larsson, MD, 1 tablet at 08/19/18 0626 .  HYDROmorphone (DILAUDID) injection 1 mg, 1 mg, Intravenous, Q2H PRN, Pool, Mallie Mussel, MD .  insulin aspart (novoLOG) injection 0-15 Units, 0-15 Units, Subcutaneous, TID WC, Pool, Henry, MD .  insulin aspart (novoLOG) injection 0-5 Units, 0-5 Units, Subcutaneous, QHS, Earnie Larsson, MD, 4 Units at 08/18/18 2156 .  levothyroxine (SYNTHROID) tablet 137 mcg, 137 mcg, Oral, QAC breakfast, Earnie Larsson, MD, 137 mcg at 08/19/18 9485 .  LORazepam (ATIVAN) tablet 1 mg, 1 mg, Oral, BID, Earnie Larsson, MD, 1 mg at 08/18/18 2155 .  menthol-cetylpyridinium (CEPACOL) lozenge 3 mg, 1 lozenge, Oral, PRN **OR** phenol (CHLORASEPTIC) mouth spray 1 spray, 1 spray, Mouth/Throat, PRN, Pool, Henry, MD .  ondansetron (ZOFRAN) tablet 4 mg, 4 mg, Oral, Q6H PRN **OR** ondansetron (ZOFRAN) injection 4 mg, 4 mg, Intravenous, Q6H PRN, Earnie Larsson, MD .  sertraline (ZOLOFT) tablet 50 mg, 50 mg, Oral, Daily, Pool, Henry, MD .  sodium chloride flush (NS) 0.9 % injection 3 mL, 3 mL,  Intravenous, Q12H, Julio SicksPool, Henry, MD, 3 mL at 08/18/18 2156 .  sodium chloride flush (NS) 0.9 % injection 3 mL, 3 mL, Intravenous, PRN, Julio SicksPool, Henry, MD .  traZODone (DESYREL) tablet 50 mg, 50 mg, Oral, QHS, Pool, Sherilyn CooterHenry, MD .  triamterene-hydrochlorothiazide (MAXZIDE-25) 37.5-25 MG per tablet 1 tablet, 1 tablet, Oral, Daily, Julio SicksPool, Henry, MD   Physical Exam: AOx3, PERRL, EOMI, FS, Strength 5/5 x4, SILTx4, no drift  Assessment & Plan: 85 y.o. woman s/p lumbar microdiscectomy, recovering well. -discharge home today with home pt/ot  Jadene Pierinihomas A Neal Trulson  08/19/18 8:23 AM

## 2018-08-19 NOTE — Evaluation (Signed)
Physical Therapy Evaluation Patient Details Name: Kelly Barker MRN: [id number] DOB: 06-Dec-1925 Today's Date: 08/19/2018   History of Present Illness  Pt is a 85 y/o female who presents s/p L4-L5 laminectomy/decompression on 08/18/2018.   Clinical Impression  Pt admitted with above diagnosis. Pt currently with functional limitations due to the deficits listed below (see PT Problem List). At the time of PT eval pt was able to perform transfers and ambulation with gross min guard assist for balance support and safety with SPC. Pt was educated on precautions, car transfer, stair negotiation, and activity progression. Pt will benefit from skilled PT to increase their independence and safety with mobility to allow discharge to the venue listed below.       Follow Up Recommendations Home health PT;Supervision for mobility/OOB    Equipment Recommendations  None recommended by PT    Recommendations for Other Services       Precautions / Restrictions Precautions Precautions: Fall;Back Precaution Booklet Issued: Yes (comment) Precaution Comments: Reviewed handout in detail. Pt was cued for precautions during functional mobility.  Required Braces or Orthoses: ("No brace needed" order) Restrictions Weight Bearing Restrictions: No      Mobility  Bed Mobility Overal bed mobility: Needs Assistance Bed Mobility: Rolling;Sidelying to Sit Rolling: Modified independent (Device/Increase time) Sidelying to sit: Supervision       General bed mobility comments: VC's for proper log roll technique. Heavy use of railing for support.   Transfers Overall transfer level: Needs assistance Equipment used: Straight cane Transfers: Sit to/from Stand Sit to Stand: Min guard         General transfer comment: Close guard for safety as pt powered up to full stand.   Ambulation/Gait Ambulation/Gait assistance: Min guard;Supervision Gait Distance (Feet): 250 Feet Assistive device: Straight cane Gait  Pattern/deviations: Step-through pattern;Decreased stride length;Trunk flexed Gait velocity: Decreased Gait velocity interpretation: <1.8 ft/sec, indicate of risk for recurrent falls General Gait Details: VC's for improved posture. Good sequencing with the cane.   Stairs Stairs: Yes Stairs assistance: Min guard Stair Management: One rail Right;With cane;Step to pattern;Forwards Number of Stairs: 2 General stair comments: VC's for sequencing and general safety.   Wheelchair Mobility    Modified Rankin (Stroke Patients Only)       Balance Overall balance assessment: Needs assistance Sitting-balance support: Feet supported;No upper extremity supported Sitting balance-Leahy Scale: Fair     Standing balance support: Single extremity supported;During functional activity Standing balance-Leahy Scale: Fair                               Pertinent Vitals/Pain Pain Assessment: Faces Faces Pain Scale: Hurts a little bit Pain Location: Incision site Pain Descriptors / Indicators: Operative site guarding;Discomfort Pain Intervention(s): Limited activity within patient's tolerance;Monitored during session;Repositioned    Home Living Family/patient expects to be discharged to:: Private residence Living Arrangements: Alone Available Help at Discharge: Family;Available 24 hours/day Type of Home: House Home Access: Stairs to enter Entrance Stairs-Rails: Right;Left;Can reach both Entrance Stairs-Number of Steps: 2 Home Layout: One level Home Equipment: Cane - single point;Tub bench      Prior Function Level of Independence: Independent with assistive device(s)         Comments: SPC     Hand Dominance   Dominant Hand: Right    Extremity/Trunk Assessment   Upper Extremity Assessment Upper Extremity Assessment: Defer to OT evaluation    Lower Extremity Assessment Lower Extremity Assessment: Generalized weakness(Likely age  appropriate)    Cervical / Trunk  Assessment Cervical / Trunk Assessment: Other exceptions Cervical / Trunk Exceptions: s/p surgery  Communication   Communication: No difficulties  Cognition Arousal/Alertness: Awake/alert Behavior During Therapy: WFL for tasks assessed/performed Overall Cognitive Status: Within Functional Limits for tasks assessed                                        General Comments      Exercises     Assessment/Plan    PT Assessment Patient needs continued PT services  PT Problem List Decreased strength;Decreased activity tolerance;Decreased balance;Decreased mobility;Decreased knowledge of use of DME;Decreased safety awareness;Decreased knowledge of precautions;Pain       PT Treatment Interventions DME instruction;Stair training;Gait training;Functional mobility training;Therapeutic activities;Therapeutic exercise;Neuromuscular re-education;Patient/family education    PT Goals (Current goals can be found in the Care Plan section)  Acute Rehab PT Goals Patient Stated Goal: Home today PT Goal Formulation: With patient Time For Goal Achievement: 08/26/18 Potential to Achieve Goals: Good    Frequency Min 5X/week   Barriers to discharge        Co-evaluation               AM-PAC PT "6 Clicks" Mobility  Outcome Measure Help needed turning from your back to your side while in a flat bed without using bedrails?: None Help needed moving from lying on your back to sitting on the side of a flat bed without using bedrails?: A Little Help needed moving to and from a bed to a chair (including a wheelchair)?: A Little Help needed standing up from a chair using your arms (e.g., wheelchair or bedside chair)?: A Little Help needed to walk in hospital room?: A Little Help needed climbing 3-5 steps with a railing? : A Little 6 Click Score: 19    End of Session Equipment Utilized During Treatment: Gait belt Activity Tolerance: Patient tolerated treatment well Patient left:  with call bell/phone within reach;Other (comment)(Sitting EOB eating breakfast) Nurse Communication: Mobility status PT Visit Diagnosis: Unsteadiness on feet (R26.81);Pain;Other symptoms and signs involving the nervous system (R29.898) Pain - part of body: (back)    Time: [id number] PT Time Calculation (min) (ACUTE ONLY): 25 min   Charges:   PT Evaluation $PT Eval Moderate Complexity: 1 Mod PT Treatments $Gait Training: 8-22 mins        Conni SlipperLaura Rollie Hynek, PT, DPT Acute Rehabilitation Services Pager: [phone] Office: [phone]   Marylynn PearsonLaura D Luciel Brickman 08/19/2018, 10:10 AM

## 2018-08-19 NOTE — Evaluation (Signed)
Occupational Therapy Evaluation Patient Details Name: Kelly Barker MRN: [id number] DOB: [birth year] Today's Date: 08/19/2018    History of Present Illness Pt is a 85 y/o female who presents s/p L4-L5 laminectomy/decompression on 08/18/2018.    Clinical Impression   Patient is s/p L4-L5 laminectomy/decompression  surgery resulting in the deficits listed below (see OT Problem List). Patient lives alone but will have daughter coming into town to assist her from [place] and son who lives near by. Patient requires cues on pacing self and use of AE to complete dressing and hygiene. Patient has a transfer bench at home for showers and recommended to use 3 in 1 for toilet tasks.       Follow Up Recommendations  Home health OT;Supervision/Assistance - 24 hour    Equipment Recommendations  3 in 1 bedside commode    Recommendations for Other Services       Precautions / Restrictions Precautions Precautions: Fall;Back Precaution Booklet Issued: Yes (comment) Precaution Comments: Reviewed handout in detail. Pt was cued for precautions during functional mobility.  Required Braces or Orthoses: ("No brace needed" order) Restrictions Weight Bearing Restrictions: No      Mobility Bed Mobility Overal bed mobility: Needs Assistance Bed Mobility: Rolling;Sidelying to Sit Rolling: Modified independent (Device/Increase time) Sidelying to sit: Supervision       General bed mobility comments: VC's for proper log roll technique. Heavy use of railing for support.   Transfers Overall transfer level: Needs assistance Equipment used: Straight cane Transfers: Sit to/from Stand Sit to Stand: Min guard         General transfer comment: Close guard for safety as pt powered up to full stand.     Balance Overall balance assessment: Needs assistance Sitting-balance support: Feet supported;No upper extremity supported Sitting balance-Leahy Scale: Fair     Standing balance support: Single extremity  supported;During functional activity Standing balance-Leahy Scale: Fair                             ADL either performed or assessed with clinical judgement   ADL Overall ADL's : Needs assistance/impaired Eating/Feeding: Independent;Sitting   Grooming: Wash/dry hands;Wash/dry face;Supervision/safety;Cueing for safety;Cueing for sequencing;Standing   Upper Body Bathing: Modified independent;Sitting   Lower Body Bathing: Supervison/ safety;Sit to/from stand   Upper Body Dressing : Modified independent;Cueing for safety;Cueing for sequencing   Lower Body Dressing: Supervision/safety;Cueing for safety;Cueing for sequencing;Sit to/from stand   Toilet Transfer: Supervision/safety;Regular Toilet;Grab bars   Toileting- ArchitectClothing Manipulation and Hygiene: Supervision/safety;Cueing for safety;Cueing for sequencing;Sit to/from stand   Tub/ Shower Transfer: Tub transfer;Min guard;Cueing for safety;Cueing for sequencing;Adhering to back precautions   Functional mobility during ADLs: Supervision/safety;Cueing for safety;Cueing for sequencing;Cane General ADL Comments: cues on breathing technique     Vision Baseline Vision/History: Wears glasses Wears Glasses: At all times Patient Visual Report: No change from baseline Vision Assessment?: No apparent visual deficits     Perception Perception Perception Tested?: No   Praxis Praxis Praxis tested?: Not tested    Pertinent Vitals/Pain Pain Assessment: Faces Faces Pain Scale: Hurts a little bit Pain Location: Incision site Pain Descriptors / Indicators: Operative site guarding;Discomfort Pain Intervention(s): Limited activity within patient's tolerance;Monitored during session;Repositioned     Hand Dominance Right   Extremity/Trunk Assessment Upper Extremity Assessment Upper Extremity Assessment: Defer to OT evaluation   Lower Extremity Assessment Lower Extremity Assessment: Generalized weakness(Likely age  appropriate)   Cervical / Trunk Assessment Cervical / Trunk Assessment: Other exceptions Cervical /  Trunk Exceptions: s/p surgery   Communication Communication Communication: No difficulties   Cognition Arousal/Alertness: Awake/alert Behavior During Therapy: WFL for tasks assessed/performed Overall Cognitive Status: Within Functional Limits for tasks assessed                                     General Comments       Exercises     Shoulder Instructions      Home Living Family/patient expects to be discharged to:: Private residence Living Arrangements: Alone Available Help at Discharge: Family;Available 24 hours/day Type of Home: House Home Access: Stairs to enter [employer] of Steps: 2 Entrance Stairs-Rails: Right;Left;Can reach both Home Layout: One level     Bathroom Shower/Tub: Teacher, early years/pre: Standard Bathroom Accessibility: Yes How Accessible: Accessible via walker Home Equipment: Cane - single point;Tub bench          Prior Functioning/Environment Level of Independence: Independent with assistive device(s)        Comments: SPC        OT Problem List: Decreased strength;Decreased range of motion;Decreased activity tolerance;Impaired balance (sitting and/or standing);Decreased safety awareness;Decreased knowledge of use of DME or AE;Pain      OT Treatment/Interventions:      OT Goals(Current goals can be found in the care plan section) Acute Rehab OT Goals Patient Stated Goal: Home today OT Goal Formulation: With patient Time For Goal Achievement: 08/19/18 Potential to Achieve Goals: Good  OT Frequency:     Barriers to D/Barker:            Co-evaluation              AM-PAC OT "6 Clicks" Daily Activity     Outcome Measure Help from another person eating meals?: None Help from another person taking care of personal grooming?: None Help from another person toileting, which includes using toliet,  bedpan, or urinal?: None Help from another person bathing (including washing, rinsing, drying)?: A Little Help from another person to put on and taking off regular upper body clothing?: None Help from another person to put on and taking off regular lower body clothing?: A Little 6 Click Score: 22   End of Session Equipment Utilized During Treatment: [facility] 2) Nurse Communication: (AE needs )  Activity Tolerance: Patient limited by fatigue Patient left: in bed;with call bell/phone within reach  OT Visit Diagnosis: Unsteadiness on feet (R26.81);Repeated falls (R29.6);Muscle weakness (generalized) (M62.81);Pain Pain - part of body: (back)                Time: 8841-6606 OT Time Calculation (min): 45 min Charges:  OT General Charges $OT Visit: 1 Visit OT Evaluation $OT Eval Low Complexity: 1 Low OT Treatments $Self Care/Home Management : 23-37 mins  Joeseph Amor OTR/L  Acute Rehab Services  [phone] office number [phone] pager number   Joeseph Amor 08/19/2018, 10:13 AM

## 2018-08-19 NOTE — Discharge Instructions (Addendum)
Wound Care Keep incision covered and dry for two days.  If you shower, cover incision with plastic wrap.  Do not put any creams, lotions, or ointments on incision. Leave steri-strips on back.  They will fall off by themselves. Activity Walk each and every day, increasing distance each day. No lifting greater than 5 lbs.  Avoid excessive neck motion. No driving for 2 weeks; may ride as a passenger locally. If provided with back brace, wear when out of bed.  It is not necessary to wear brace in bed. Diet Resume your normal diet.  Return to Work Will be discussed at you follow up appointment. Call Your Doctor If Any of These Occur Redness, drainage, or swelling at the wound.  Temperature greater than 101 degrees. Severe pain not relieved by pain medication. Incision starts to come apart. Follow Up Appt Call today for appointment in 1-2 weeks ([phone]) or for problems. Marland Kitchen

## 2018-08-19 NOTE — Plan of Care (Signed)
Patient alert and oriented, mae's well, voiding adequate amount of urine, swallowing without difficulty, no c/o pain at time of discharge. Patient discharged home with family. Script and discharged instructions given to patient. Patient and family stated understanding of instructions given. Patient has an appointment with Dr. Pool  

## 2018-08-19 NOTE — TOC Transition Note (Signed)
Transition of Care [facility]) - CM/SW Discharge Note   Patient Details  Name: [name] MRN: [id number] Date of Birth: October 04, 1925  [facility]) CM/SW Contact:  Claudie Leach, RN Phone Number: 08/19/2018, 11:18 AM   Clinical Narrative:    Pt to return home with assistance of family.  Patient has no preference of home health agency.     Final next level of care: [place] Barriers to Discharge: No Barriers Identified   Patient Goals and CMS Choice Patient states their goals for this hospitalization and ongoing recovery are:: to get back home CMS Medicare.gov Compare Post Acute Care list provided to:: Patient Choice offered to / list presented to : Patient   Discharge Plan and Services    HH Arranged: PT, OT [facility] Agency: [place] Date [place]: 08/19/18 Time [place]: 3875 Representative spoke with at [place]: Tommi Rumps

## 2018-08-19 NOTE — Discharge Summary (Signed)
Discharge Summary  Date of Admission: 08/18/2018  Date of Discharge: 08/19/18  Attending Physician: Earnie Larsson, MD  Hospital Course: Patient was admitted following an uncomplicated lumbar microdiscectomy. Her was recovered in PACU and transferred to [facility]. Her hospital course was uncomplicated and the patient was discharged home on 08/19/2018. She will follow up in clinic with Dr. Annette Stable in 2 weeks.  Neurologic exam at discharge:  AOx3, PERRL, EOMI, FS, TM Strength 5/5 x4, SILTx4  Discharge diagnosis: Lumbar radiculopathy  Judith Part, MD 08/19/18 8:22 AM

## 2018-08-21 LAB — GLUCOSE, CAPILLARY: Glucose-Capillary: 121 mg/dL — ABNORMAL HIGH (ref 70–99)

## 2019-03-30 ENCOUNTER — Ambulatory Visit: Payer: Medicare Other | Attending: Internal Medicine

## 2019-03-30 DIAGNOSIS — Z23 Encounter for immunization: Secondary | ICD-10-CM

## 2019-03-30 NOTE — Progress Notes (Signed)
   Covid-19 Vaccination Clinic  Name:  SHARAH FINNELL    MRN: [id number] DOB: 11/24/1925  03/30/2019  Ms. Haydon was observed post Covid-19 immunization for 15 minutes without incident. She was provided with Vaccine Information Sheet and instruction to access the V-Safe system.   Ms. Brinker was instructed to call 911 with any severe reactions post vaccine: Marland Kitchen Difficulty breathing  . Swelling of face and throat  . A fast heartbeat  . A bad rash all over body  . Dizziness and weakness   Immunizations Administered    Name Date Dose VIS Date Route   Pfizer COVID-19 Vaccine 03/30/2019 12:56 PM 0.3 mL 12/29/2018 Intramuscular   Manufacturer: [employer], [employer]   Lot: [id number]   NDC: [address]-7

## 2019-04-23 ENCOUNTER — Ambulatory Visit: Payer: Medicare Other | Attending: Internal Medicine

## 2019-04-23 DIAGNOSIS — Z23 Encounter for immunization: Secondary | ICD-10-CM

## 2019-04-23 NOTE — Progress Notes (Signed)
   Covid-19 Vaccination Clinic  Name:  Kelly Barker    MRN: [id number] DOB: [birth year]  04/23/2019  [name] was observed post Covid-19 immunization for 15 minutes without incident. She was provided with Vaccine Information Sheet and instruction to access the V-Safe system.   [name] was instructed to call 911 with any severe reactions post vaccine: Marland Kitchen Difficulty breathing  . Swelling of face and throat  . A fast heartbeat  . A bad rash all over body  . Dizziness and weakness   Immunizations Administered    Name Date Dose VIS Date Route   Pfizer COVID-19 Vaccine 04/23/2019  3:00 PM 0.3 mL 12/29/2018 Intramuscular   Manufacturer: [employer], [employer]   Lot: [id number]   NDC: [address]-1

## 2020-02-15 DIAGNOSIS — N1831 Chronic kidney disease, stage 3a: Secondary | ICD-10-CM | POA: Diagnosis not present

## 2020-02-15 DIAGNOSIS — G8929 Other chronic pain: Secondary | ICD-10-CM | POA: Diagnosis not present

## 2020-02-15 DIAGNOSIS — E1142 Type 2 diabetes mellitus with diabetic polyneuropathy: Secondary | ICD-10-CM | POA: Diagnosis not present

## 2020-02-15 DIAGNOSIS — I700 Atherosclerosis of aorta: Secondary | ICD-10-CM | POA: Diagnosis not present

## 2020-02-15 DIAGNOSIS — Z7984 Long term (current) use of oral hypoglycemic drugs: Secondary | ICD-10-CM | POA: Diagnosis not present

## 2020-02-15 DIAGNOSIS — E7800 Pure hypercholesterolemia, unspecified: Secondary | ICD-10-CM | POA: Diagnosis not present

## 2020-02-15 DIAGNOSIS — M5441 Lumbago with sciatica, right side: Secondary | ICD-10-CM | POA: Diagnosis not present

## 2020-02-15 DIAGNOSIS — I129 Hypertensive chronic kidney disease with stage 1 through stage 4 chronic kidney disease, or unspecified chronic kidney disease: Secondary | ICD-10-CM | POA: Diagnosis not present

## 2020-02-18 DIAGNOSIS — E1142 Type 2 diabetes mellitus with diabetic polyneuropathy: Secondary | ICD-10-CM | POA: Diagnosis not present

## 2020-02-18 DIAGNOSIS — E1122 Type 2 diabetes mellitus with diabetic chronic kidney disease: Secondary | ICD-10-CM | POA: Diagnosis not present

## 2020-02-18 DIAGNOSIS — E039 Hypothyroidism, unspecified: Secondary | ICD-10-CM | POA: Diagnosis not present

## 2020-02-18 DIAGNOSIS — N1831 Chronic kidney disease, stage 3a: Secondary | ICD-10-CM | POA: Diagnosis not present

## 2020-02-18 DIAGNOSIS — M8580 Other specified disorders of bone density and structure, unspecified site: Secondary | ICD-10-CM | POA: Diagnosis not present

## 2020-02-18 DIAGNOSIS — E7800 Pure hypercholesterolemia, unspecified: Secondary | ICD-10-CM | POA: Diagnosis not present

## 2020-02-18 DIAGNOSIS — G8929 Other chronic pain: Secondary | ICD-10-CM | POA: Diagnosis not present

## 2020-02-18 DIAGNOSIS — I129 Hypertensive chronic kidney disease with stage 1 through stage 4 chronic kidney disease, or unspecified chronic kidney disease: Secondary | ICD-10-CM | POA: Diagnosis not present

## 2020-03-14 DIAGNOSIS — I10 Essential (primary) hypertension: Secondary | ICD-10-CM | POA: Diagnosis not present

## 2020-03-17 DIAGNOSIS — I10 Essential (primary) hypertension: Secondary | ICD-10-CM | POA: Diagnosis not present

## 2020-03-17 DIAGNOSIS — E039 Hypothyroidism, unspecified: Secondary | ICD-10-CM | POA: Diagnosis not present

## 2020-03-17 DIAGNOSIS — G8929 Other chronic pain: Secondary | ICD-10-CM | POA: Diagnosis not present

## 2020-03-17 DIAGNOSIS — I129 Hypertensive chronic kidney disease with stage 1 through stage 4 chronic kidney disease, or unspecified chronic kidney disease: Secondary | ICD-10-CM | POA: Diagnosis not present

## 2020-03-17 DIAGNOSIS — E1142 Type 2 diabetes mellitus with diabetic polyneuropathy: Secondary | ICD-10-CM | POA: Diagnosis not present

## 2020-03-17 DIAGNOSIS — E7800 Pure hypercholesterolemia, unspecified: Secondary | ICD-10-CM | POA: Diagnosis not present

## 2020-03-17 DIAGNOSIS — M8580 Other specified disorders of bone density and structure, unspecified site: Secondary | ICD-10-CM | POA: Diagnosis not present

## 2020-03-17 DIAGNOSIS — N1831 Chronic kidney disease, stage 3a: Secondary | ICD-10-CM | POA: Diagnosis not present

## 2020-03-17 DIAGNOSIS — E1122 Type 2 diabetes mellitus with diabetic chronic kidney disease: Secondary | ICD-10-CM | POA: Diagnosis not present

## 2020-03-26 DIAGNOSIS — M5416 Radiculopathy, lumbar region: Secondary | ICD-10-CM | POA: Diagnosis not present

## 2020-04-01 ENCOUNTER — Emergency Department (HOSPITAL_COMMUNITY): Payer: Medicare Other

## 2020-04-01 ENCOUNTER — Encounter (HOSPITAL_COMMUNITY): Payer: Self-pay | Admitting: Emergency Medicine

## 2020-04-01 ENCOUNTER — Emergency Department (HOSPITAL_COMMUNITY)
Admission: EM | Admit: 2020-04-01 | Discharge: 2020-04-01 | Disposition: A | Discharge status: Home / Self Care | Payer: Medicare Other | Attending: Emergency Medicine | Admitting: Emergency Medicine

## 2020-04-01 DIAGNOSIS — E1122 Type 2 diabetes mellitus with diabetic chronic kidney disease: Secondary | ICD-10-CM | POA: Insufficient documentation

## 2020-04-01 DIAGNOSIS — I129 Hypertensive chronic kidney disease with stage 1 through stage 4 chronic kidney disease, or unspecified chronic kidney disease: Secondary | ICD-10-CM | POA: Insufficient documentation

## 2020-04-01 DIAGNOSIS — Z79899 Other long term (current) drug therapy: Secondary | ICD-10-CM | POA: Insufficient documentation

## 2020-04-01 DIAGNOSIS — E039 Hypothyroidism, unspecified: Secondary | ICD-10-CM | POA: Diagnosis not present

## 2020-04-01 DIAGNOSIS — M5416 Radiculopathy, lumbar region: Secondary | ICD-10-CM | POA: Diagnosis not present

## 2020-04-01 DIAGNOSIS — N183 Chronic kidney disease, stage 3 unspecified: Secondary | ICD-10-CM | POA: Diagnosis not present

## 2020-04-01 DIAGNOSIS — M545 Low back pain, unspecified: Secondary | ICD-10-CM | POA: Diagnosis not present

## 2020-04-01 DIAGNOSIS — Z7984 Long term (current) use of oral hypoglycemic drugs: Secondary | ICD-10-CM | POA: Diagnosis not present

## 2020-04-01 MED ORDER — HYDROCODONE-ACETAMINOPHEN 5-325 MG PO TABS
1.0000 | ORAL_TABLET | Freq: Once | ORAL | Status: AC
  Administered 2020-04-01: 1 via ORAL
  Filled 2020-04-01: qty 1

## 2020-04-01 MED ORDER — DEXAMETHASONE SODIUM PHOSPHATE 10 MG/ML IJ SOLN
8.0000 mg | Freq: Once | INTRAMUSCULAR | Status: AC
  Administered 2020-04-01: 8 mg via INTRAMUSCULAR
  Filled 2020-04-01: qty 1

## 2020-04-01 MED ORDER — FENTANYL CITRATE (PF) 100 MCG/2ML IJ SOLN
50.0000 ug | Freq: Once | INTRAMUSCULAR | Status: DC
  Filled 2020-04-01: qty 2

## 2020-04-01 MED ORDER — HYDROCODONE-ACETAMINOPHEN 5-325 MG PO TABS: 1.0000 | ORAL_TABLET | Freq: Four times a day (QID) | ORAL | 0 refills | Status: AC | PRN

## 2020-04-01 MED ORDER — FENTANYL CITRATE (PF) 100 MCG/2ML IJ SOLN
50.0000 ug | Freq: Once | INTRAMUSCULAR | Status: AC
  Administered 2020-04-01: 50 ug via SUBCUTANEOUS

## 2020-04-01 NOTE — ED Notes (Signed)
Pt d/c by MD and provided w/ d/c instructions and follow up care, pt out of ED with family in wheelchair

## 2020-04-01 NOTE — ED Provider Notes (Signed)
[facility] EMERGENCY [facility]MENT Provider Note   CSN: [id number] Arrival date & time: 04/01/20  1140     History Chief Complaint  Patient presents with  . Back Pain  . Leg Pain    Kelly Barker is a 87 y.o. female past medical history of diabetes, CKD, hypertension, chronic back pain with lumbar radiculopathy, presenting to the emergency department with low back pain.  She is followed by Dr. Dutch QuintPoole and Dr. Murray HodgkinsBartko. Dr. Murray HodgkinsBartko recently did a steroid injection on Wednesday last week for her back pain. She states she felt great for the 2 days following the injection.  However, on Saturday symptoms worsen.  Is worse to the right lower back and radiates down the anterior thigh and leg.  Pain normally radiates down her leg though is more in the medial aspect in her groin.  She has no new numbness or weakness in the leg.  No new bowel or bladder incontinence, saddle paresthesia, abdominal pain, fever, urinary symptoms.  Treated her symptoms with Tylenol with minimal relief.  Called her neurosurgeon's office today, however the nurse instructed her to come to the ED if she was having severe pain.  The history is provided by the patient.       Past Medical History:  Diagnosis Date  . Alopecia (capitis) totalis   . Chronic back pain   . Chronic headaches    migraines until menopause none since  . Chronic renal disease, stage 3, moderately decreased glomerular filtration rate between 30-59 mL/min/1.73 square meter (HCC)   . Degenerative joint disease   . Depression   . Diabetes mellitus due to underlying condition with diabetic chronic kidney disease (HCC)    type 2  . Diverticulitis 08/2016  . Diverticulosis   . Dyspnea    with exertion  . Hematuria, microscopic   . Hyperlipidemia   . Hypertension   . Hypothyroidism   . Osteopenia   . Thrombocytopenia (HCC)   . Vitamin D deficiency     Patient Active Problem List   Diagnosis Date Noted  . Lumbar radiculopathy  08/18/2018  . Diverticulitis 09/05/2016  . Essential hypertension 09/05/2016  . Diabetes mellitus type 2 in obese (HCC) 09/05/2016  . Hypothyroidism 09/05/2016  . CKD (chronic kidney disease) stage 3, GFR 30-59 ml/min (HCC) 09/05/2016  . History of left cataract surgery 09/05/2016    Past Surgical History:  Procedure Laterality Date  . bladder stretching surgery  yrs ago   x 2  . CATARACT EXTRACTION, BILATERAL  6/18, 8/18  . CYSTOSCOPY WITH URETHRAL DILATATION N/A 11/29/2017   Procedure: CYSTOSCOPY WITH URETHRAL DILATATION;  Surgeon: Malen GauzeMcKenzie, Patrick L, MD;  [facility] [place];  Service: Urology;  Laterality: N/A;  30 MINS  . LUMBAR LAMINECTOMY/DECOMPRESSION MICRODISCECTOMY Left 08/18/2018   Procedure: Microdiscectomy - left - Lumbar four-lumbar five;  Surgeon: Julio SicksPool, Henry, MD;  [facility];  Service: Neurosurgery;  Laterality: Left;     OB History   No obstetric history on file.     Family History  Problem Relation Age of Onset  . Uterine cancer Mother   . Bladder Cancer Father   . Dementia Sister     Social History   Tobacco Use  . Smoking status: Never Smoker  . Smokeless tobacco: Never Used  Vaping Use  . Vaping Use: Never used  Substance Use Topics  . Alcohol use: No  . Drug use: No    Home Medications Prior to Admission medications  Medication Sig Start Date End Date Taking? Authorizing Provider  acetaminophen (TYLENOL) 325 MG tablet Take 325 mg by mouth every 6 (six) hours as needed for mild pain.    [provider]  amLODipine (NORVASC) 5 MG tablet Take 5 mg by mouth daily.    [provider]  atenolol (TENORMIN) 50 MG tablet Take 50 mg by mouth daily.    [provider]  cyclobenzaprine (FLEXERIL) 5 MG tablet Take 1 tablet (5 mg total) by mouth 3 (three) times daily as needed for muscle spasms. 08/19/18   Jadene Pierini, MD  diclofenac sodium (VOLTAREN) 1 % GEL Apply 4 g topically 4 (four) times  daily. Patient not taking: Reported on 08/15/2018 08/07/18   Melene Plan, DO  gabapentin (NEURONTIN) 100 MG capsule Take 300 mg by mouth 2 (two) times a day. 07/06/18   [provider]  glimepiride (AMARYL) 2 MG tablet Take 2 mg by mouth daily. With main meal daily 08/07/18   [provider]  HYDROcodone-acetaminophen (NORCO/VICODIN) 5-325 MG tablet Take 1 tablet by mouth every 4 (four) hours as needed (pain). 08/19/18   Jadene Pierini, MD  levothyroxine (SYNTHROID, LEVOTHROID) 137 MCG tablet Take 137 mcg by mouth daily before breakfast.     [provider]  LORazepam (ATIVAN) 1 MG tablet Take 1 mg by mouth 2 (two) times daily. Take one tablet twice daily.    [provider]  sertraline (ZOLOFT) 50 MG tablet Take 50 mg by mouth daily.    [provider]  traZODone (DESYREL) 50 MG tablet Take 50 mg by mouth at bedtime.    [provider]  triamterene-hydrochlorothiazide (DYAZIDE) 37.5-25 MG per capsule Take 1 capsule by mouth every morning.    [provider]    Allergies    Crestor [rosuvastatin], Lisinopril, Pravachol [pravastatin sodium], Simvastatin, and Tolectin [tolmetin]  Review of Systems   Review of Systems  Musculoskeletal: Positive for back pain.  All other systems reviewed and are negative.   Physical Exam Updated Vital Signs BP (!) 175/67 (BP Location: Left Arm)   Pulse 60   Temp (!) 97.5 F (36.4 C)   Resp 15   SpO2 96%   Physical Exam Vitals and nursing note reviewed.  Constitutional:      General: She is not in acute distress.    Appearance: She is well-developed.  HENT:     Head: Normocephalic and atraumatic.  Eyes:     Conjunctiva/sclera: Conjunctivae normal.  Cardiovascular:     Rate and Rhythm: Normal rate and regular rhythm.  Pulmonary:     Effort: Pulmonary effort is normal.     Breath sounds: Normal breath sounds.  Abdominal:     General: Bowel sounds are normal.     Palpations: Abdomen is  soft.     Tenderness: There is no abdominal tenderness.  Musculoskeletal:     Comments: Generalized tenderness to the lower lumbar region, worse in the right paraspinal region and right upper gluteal region.  Negative straight leg raise.  Skin:    General: Skin is warm.  Neurological:     Mental Status: She is alert.     Comments: Normal tone.  4/5 strength in BLE including strong and equal dorsiflexion/plantar flexion Sensory:  light touch normal in BLE extremities.   Gait: steady slow gait CV: distal pulses palpable throughout    Psychiatric:        Behavior: Behavior normal.     ED Results / Procedures /  Treatments   Labs (all labs ordered are listed, but only abnormal results are displayed) Labs Reviewed - No data to display  EKG None  Radiology CT Lumbar Spine Wo Contrast  Result Date: 04/01/2020 CLINICAL DATA:  Back pain and leg pain EXAM: CT LUMBAR SPINE WITHOUT CONTRAST TECHNIQUE: Multidetector CT imaging of the lumbar spine was performed without intravenous contrast administration. Multiplanar CT image reconstructions were also generated. COMPARISON:  Radiography 08/18/2018.  MRI 02/02/2018. FINDINGS: Segmentation: 5 lumbar type vertebral bodies. Alignment: Normal Vertebrae: No fracture or primary bone lesion. Chronic discogenic endplate changes at L4-5, worsened since previous studies. Paraspinal and other soft tissues: Aortic atherosclerosis. No other notable finding. Disc levels: No significant finding from T11-12 through L3-4. Mild bulging of the discs. No compressive canal or foraminal stenosis. L4-5: Advanced disc degeneration with loss of disc height and vacuum phenomenon. Chronic endplate changes. Endplate osteophytes and bulging of the disc. Previous partial hemilaminectomy on the left. Some potential for spinal stenosis at this level, even considering the previous laminectomy. Neural foramina appear sufficiently patent. Findings at this level could certainly relate to  back pain. L5-S1: Bulging of the disc. Facet and ligamentous hypertrophy. Stenosis of the subarticular lateral recesses that could possibly affect the S1 nerves. The facet arthritis at this level could contribute to low back pain. IMPRESSION: 1. L4-5: Advanced and progressive disc degeneration with loss of disc height and vacuum phenomenon. Chronic endplate changes. Previous partial hemilaminectomy on the left. Endplate osteophytes and bulging of the disc. Some potential for spinal stenosis at this level, even considering the previous laminectomy. Neural foramina appear sufficiently patent. Findings at this level could certainly relate to back pain. 2. L5-S1: Bulging of the disc. Facet and ligamentous hypertrophy. Stenosis of the subarticular lateral recesses that could possibly affect the S1 nerves. The facet arthritis at this level could contribute to low back pain. Aortic Atherosclerosis (ICD10-I70.0). Electronically Signed   By: Paulina Fusi M.D.   On: 04/01/2020 14:49    Procedures Procedures   Medications Ordered in ED Medications  dexamethasone (DECADRON) injection 8 mg (has no administration in time range)  HYDROcodone-acetaminophen (NORCO/VICODIN) 5-325 MG per tablet 1 tablet (has no administration in time range)  fentaNYL (SUBLIMAZE) injection 50 mcg (50 mcg Subcutaneous Given 04/01/20 1332)    ED Course  I have reviewed the triage vital signs and the nursing notes.  Pertinent labs & imaging results that were available during my care of the patient were reviewed by me and considered in my medical decision making (see chart for details).  Clinical Course as of 04/01/20 1543  Tue Apr 01, 2020  [id number] 87 year old female with chronic back pain and radicular symptoms here with worsening symptoms over the last few days.  She had a spinal injection recently she said it only lasted for a few days before the pain recurred.  No new symptoms.  CT imaging showing some degenerative changes but no acute  findings. [MB]    Clinical Course User Index [MB] Terrilee Files, MD   MDM Rules/Calculators/A&P                         Patient with chronic lumbar radiculopathy, followed by [facility] neurosurgery, presenting with worsening pain.  No new neuro deficits.  Had steroid injection on Wednesday last week and had 2 days of symptom relief, however symptoms returned and were worse than before.  She has no new neuro deficits on exam today.  She appears uncomfortable  though is able to ambulate with steady gait.  Will obtain CT images for any acute pathology, treat pain and reevaluate.   CT findings are similar to prior MRI. Patient's pain was improved with fentanyl though returns on re-evaluation. Will give a dose of decadron and norco. She has been ambulated by myself as well as ambulating to the restroom with steady gait.   Care assumed at shift change by Placido Sou, PA-C. Plan to re-evaluate for some improvement in pain. Anticipate discharge to home with rx for norco, and close outpatient follow up, return precautions. Discussed with care plan with patient, she is in agreement.  Final Clinical Impression(s) / ED Diagnoses Final diagnoses:  Lumbar radiculopathy, chronic    Rx / DC Orders ED Discharge Orders    None       Robinson, [place] N, PA-C 04/01/20 1548    Terrilee Files, MD 04/01/20 1757

## 2020-04-01 NOTE — Discharge Instructions (Addendum)
I am prescribing you a short course of the medication called Vicodin.  This is a strong pain medication that you have taken in the past.  This is a narcotic pain medication so please be sure not to mix with alcohol.  Do not operate a motor vehicle after taking it.  This medication can increase your risk of falls.  Only take it as prescribed.  Please follow-up with your neurosurgeon tomorrow.  If your symptoms worsen, you can return to the emergency department.  It was a pleasure to meet you.

## 2020-04-01 NOTE — ED Triage Notes (Signed)
Reports severe lower back pain that radiates to R leg.  States she received spinal injection on Wednesday and felt great Thursday and Friday but pain is worse now.  Dr. Dutch Quint is her neurosurgeon.

## 2020-04-01 NOTE — ED Provider Notes (Signed)
Patient is a 87 year old female whose care was transferred me at shift change by Leim Fabry.  Her HPI is below:  Kelly Barker is a 87 y.o. female past medical history of diabetes, CKD, hypertension, chronic back pain with lumbar radiculopathy, presenting to the emergency department with low back pain.  She is followed by Dr. Dutch Quint and Dr. Murray Hodgkins. Dr. Murray Hodgkins recently did a steroid injection on Wednesday last week for her back pain. She states she felt great for the 2 days following the injection.  However, on Saturday symptoms worsen.  Is worse to the right lower back and radiates down the anterior thigh and leg.  Pain normally radiates down her leg though is more in the medial aspect in her groin.  She has no new numbness or weakness in the leg.  No new bowel or bladder incontinence, saddle paresthesia, abdominal pain, fever, urinary symptoms.  Treated her symptoms with Tylenol with minimal relief.  Called her neurosurgeon's office today, however the nurse instructed her to come to the ED if she was having severe pain.  The history is provided by the patient.   Physical Exam  BP (!) 157/73 (BP Location: Left Arm)   Pulse 72   Temp (!) 97.5 F (36.4 C)   Resp 16   SpO2 98%   Physical Exam Vitals and nursing note reviewed.  Constitutional:      General: She is not in acute distress.    Appearance: She is well-developed.  HENT:     Head: Normocephalic and atraumatic.  Eyes:     Conjunctiva/sclera: Conjunctivae normal.  Cardiovascular:     Rate and Rhythm: Normal rate and regular rhythm.  Pulmonary:     Effort: Pulmonary effort is normal.     Breath sounds: Normal breath sounds.  Abdominal:     General: Bowel sounds are normal.     Palpations: Abdomen is soft.     Tenderness: There is no abdominal tenderness.  Musculoskeletal:     Comments: Generalized tenderness to the lower lumbar region, worse in the right paraspinal region and right upper gluteal region.  Negative straight leg  raise.  Skin:    General: Skin is warm.  Neurological:     Mental Status: She is alert.     Comments: Normal tone.  4/5 strength in BLE including strong and equal dorsiflexion/plantar flexion Sensory:  light touch normal in BLE extremities.   Gait: steady slow gait CV: distal pulses palpable throughout    Psychiatric:        Behavior: Behavior normal.  ED Course/Procedures   Clinical Course as of 04/01/20 1652  Tue Apr 01, 2020  [id number] 87 year old female with chronic back pain and radicular symptoms here with worsening symptoms over the last few days.  She had a spinal injection recently she said it only lasted for a few days before the pain recurred.  No new symptoms.  CT imaging showing some degenerative changes but no acute findings. [MB]    Clinical Course User Index [MB] Terrilee Files, MD    Procedures  MDM  Patient is a 87 year old female whose care was transferred me at shift change by Leim Fabry.  She presents today with acute on chronic radiculopathy.  Seems to be worse today than normal.  She spoke to her neurosurgeon who recommended that she come to the emergency department for evaluation as well as pain management.  CT scan was obtained by previous provider and findings were similar to prior MRI.  Her pain was mildly improved with fentanyl.  She was given an additional dose of Vicodin as well as a dose of IM Decadron.  She reports moderate relief of her pain.  She request to be discharged at this time.  Feel that she is stable for discharge.  She has no new neuro deficits.  She has been ambulatory throughout the emergency department without difficulty.  We will discharge her on a very short course of Norco.  We discussed safety regarding this medication.  She is going to follow-up with neurosurgery tomorrow.  We discussed return precautions.  Her questions were answered and she was amicable at the time of discharge.       Placido Sou, PA-C 04/01/20 1656     Milagros Loll, MD 04/04/20 [phone]

## 2020-04-01 NOTE — ED Notes (Signed)
Patient transported to CT 

## 2020-04-03 DIAGNOSIS — M5416 Radiculopathy, lumbar region: Secondary | ICD-10-CM | POA: Diagnosis not present

## 2020-04-03 DIAGNOSIS — Z9889 Other specified postprocedural states: Secondary | ICD-10-CM | POA: Diagnosis not present

## 2020-04-23 DIAGNOSIS — M5416 Radiculopathy, lumbar region: Secondary | ICD-10-CM | POA: Diagnosis not present

## 2020-04-23 DIAGNOSIS — E7800 Pure hypercholesterolemia, unspecified: Secondary | ICD-10-CM | POA: Diagnosis not present

## 2020-04-23 DIAGNOSIS — N1831 Chronic kidney disease, stage 3a: Secondary | ICD-10-CM | POA: Diagnosis not present

## 2020-04-23 DIAGNOSIS — E1142 Type 2 diabetes mellitus with diabetic polyneuropathy: Secondary | ICD-10-CM | POA: Diagnosis not present

## 2020-04-23 DIAGNOSIS — I129 Hypertensive chronic kidney disease with stage 1 through stage 4 chronic kidney disease, or unspecified chronic kidney disease: Secondary | ICD-10-CM | POA: Diagnosis not present

## 2020-04-23 DIAGNOSIS — E1122 Type 2 diabetes mellitus with diabetic chronic kidney disease: Secondary | ICD-10-CM | POA: Diagnosis not present

## 2020-04-23 DIAGNOSIS — M545 Low back pain, unspecified: Secondary | ICD-10-CM | POA: Diagnosis not present

## 2020-04-23 DIAGNOSIS — E039 Hypothyroidism, unspecified: Secondary | ICD-10-CM | POA: Diagnosis not present

## 2020-04-23 DIAGNOSIS — M8580 Other specified disorders of bone density and structure, unspecified site: Secondary | ICD-10-CM | POA: Diagnosis not present

## 2020-04-28 DIAGNOSIS — E1122 Type 2 diabetes mellitus with diabetic chronic kidney disease: Secondary | ICD-10-CM | POA: Diagnosis not present

## 2020-04-28 DIAGNOSIS — Z1389 Encounter for screening for other disorder: Secondary | ICD-10-CM | POA: Diagnosis not present

## 2020-04-28 DIAGNOSIS — D696 Thrombocytopenia, unspecified: Secondary | ICD-10-CM | POA: Diagnosis not present

## 2020-04-28 DIAGNOSIS — E039 Hypothyroidism, unspecified: Secondary | ICD-10-CM | POA: Diagnosis not present

## 2020-04-28 DIAGNOSIS — E1142 Type 2 diabetes mellitus with diabetic polyneuropathy: Secondary | ICD-10-CM | POA: Diagnosis not present

## 2020-04-28 DIAGNOSIS — Z0000 Encounter for general adult medical examination without abnormal findings: Secondary | ICD-10-CM | POA: Diagnosis not present

## 2020-04-28 DIAGNOSIS — N1831 Chronic kidney disease, stage 3a: Secondary | ICD-10-CM | POA: Diagnosis not present

## 2020-04-28 DIAGNOSIS — I129 Hypertensive chronic kidney disease with stage 1 through stage 4 chronic kidney disease, or unspecified chronic kidney disease: Secondary | ICD-10-CM | POA: Diagnosis not present

## 2020-04-28 DIAGNOSIS — Z79899 Other long term (current) drug therapy: Secondary | ICD-10-CM | POA: Diagnosis not present

## 2020-04-28 DIAGNOSIS — E7800 Pure hypercholesterolemia, unspecified: Secondary | ICD-10-CM | POA: Diagnosis not present

## 2020-04-28 DIAGNOSIS — I700 Atherosclerosis of aorta: Secondary | ICD-10-CM | POA: Diagnosis not present

## 2020-04-28 DIAGNOSIS — Z789 Other specified health status: Secondary | ICD-10-CM | POA: Diagnosis not present

## 2020-04-28 DIAGNOSIS — E559 Vitamin D deficiency, unspecified: Secondary | ICD-10-CM | POA: Diagnosis not present

## 2020-04-30 DIAGNOSIS — I10 Essential (primary) hypertension: Secondary | ICD-10-CM | POA: Diagnosis not present

## 2020-04-30 DIAGNOSIS — M5416 Radiculopathy, lumbar region: Secondary | ICD-10-CM | POA: Diagnosis not present

## 2020-06-10 DIAGNOSIS — M8580 Other specified disorders of bone density and structure, unspecified site: Secondary | ICD-10-CM | POA: Diagnosis not present

## 2020-06-10 DIAGNOSIS — E039 Hypothyroidism, unspecified: Secondary | ICD-10-CM | POA: Diagnosis not present

## 2020-06-10 DIAGNOSIS — E1122 Type 2 diabetes mellitus with diabetic chronic kidney disease: Secondary | ICD-10-CM | POA: Diagnosis not present

## 2020-06-10 DIAGNOSIS — E7800 Pure hypercholesterolemia, unspecified: Secondary | ICD-10-CM | POA: Diagnosis not present

## 2020-06-10 DIAGNOSIS — N1831 Chronic kidney disease, stage 3a: Secondary | ICD-10-CM | POA: Diagnosis not present

## 2020-06-10 DIAGNOSIS — I129 Hypertensive chronic kidney disease with stage 1 through stage 4 chronic kidney disease, or unspecified chronic kidney disease: Secondary | ICD-10-CM | POA: Diagnosis not present

## 2020-06-10 DIAGNOSIS — E1142 Type 2 diabetes mellitus with diabetic polyneuropathy: Secondary | ICD-10-CM | POA: Diagnosis not present

## 2020-06-23 DIAGNOSIS — E1122 Type 2 diabetes mellitus with diabetic chronic kidney disease: Secondary | ICD-10-CM | POA: Diagnosis not present

## 2020-06-23 DIAGNOSIS — N1831 Chronic kidney disease, stage 3a: Secondary | ICD-10-CM | POA: Diagnosis not present

## 2020-06-23 DIAGNOSIS — E039 Hypothyroidism, unspecified: Secondary | ICD-10-CM | POA: Diagnosis not present

## 2020-06-23 DIAGNOSIS — M8580 Other specified disorders of bone density and structure, unspecified site: Secondary | ICD-10-CM | POA: Diagnosis not present

## 2020-06-23 DIAGNOSIS — I129 Hypertensive chronic kidney disease with stage 1 through stage 4 chronic kidney disease, or unspecified chronic kidney disease: Secondary | ICD-10-CM | POA: Diagnosis not present

## 2020-06-23 DIAGNOSIS — E7800 Pure hypercholesterolemia, unspecified: Secondary | ICD-10-CM | POA: Diagnosis not present

## 2020-06-23 DIAGNOSIS — E1142 Type 2 diabetes mellitus with diabetic polyneuropathy: Secondary | ICD-10-CM | POA: Diagnosis not present

## 2020-07-01 DIAGNOSIS — I129 Hypertensive chronic kidney disease with stage 1 through stage 4 chronic kidney disease, or unspecified chronic kidney disease: Secondary | ICD-10-CM | POA: Diagnosis not present

## 2020-07-01 DIAGNOSIS — Z7984 Long term (current) use of oral hypoglycemic drugs: Secondary | ICD-10-CM | POA: Diagnosis not present

## 2020-07-01 DIAGNOSIS — E1142 Type 2 diabetes mellitus with diabetic polyneuropathy: Secondary | ICD-10-CM | POA: Diagnosis not present

## 2020-07-01 DIAGNOSIS — R202 Paresthesia of skin: Secondary | ICD-10-CM | POA: Diagnosis not present

## 2020-07-01 DIAGNOSIS — N1831 Chronic kidney disease, stage 3a: Secondary | ICD-10-CM | POA: Diagnosis not present

## 2020-08-05 DIAGNOSIS — G629 Polyneuropathy, unspecified: Secondary | ICD-10-CM | POA: Diagnosis not present

## 2020-08-07 DIAGNOSIS — G5603 Carpal tunnel syndrome, bilateral upper limbs: Secondary | ICD-10-CM | POA: Diagnosis not present

## 2020-08-07 DIAGNOSIS — G629 Polyneuropathy, unspecified: Secondary | ICD-10-CM | POA: Diagnosis not present

## 2020-08-18 DIAGNOSIS — E7800 Pure hypercholesterolemia, unspecified: Secondary | ICD-10-CM | POA: Diagnosis not present

## 2020-08-18 DIAGNOSIS — E039 Hypothyroidism, unspecified: Secondary | ICD-10-CM | POA: Diagnosis not present

## 2020-08-18 DIAGNOSIS — E1122 Type 2 diabetes mellitus with diabetic chronic kidney disease: Secondary | ICD-10-CM | POA: Diagnosis not present

## 2020-08-18 DIAGNOSIS — N1831 Chronic kidney disease, stage 3a: Secondary | ICD-10-CM | POA: Diagnosis not present

## 2020-08-18 DIAGNOSIS — I129 Hypertensive chronic kidney disease with stage 1 through stage 4 chronic kidney disease, or unspecified chronic kidney disease: Secondary | ICD-10-CM | POA: Diagnosis not present

## 2020-08-18 DIAGNOSIS — M8580 Other specified disorders of bone density and structure, unspecified site: Secondary | ICD-10-CM | POA: Diagnosis not present

## 2020-08-18 DIAGNOSIS — E1142 Type 2 diabetes mellitus with diabetic polyneuropathy: Secondary | ICD-10-CM | POA: Diagnosis not present

## 2020-08-26 DIAGNOSIS — E1122 Type 2 diabetes mellitus with diabetic chronic kidney disease: Secondary | ICD-10-CM | POA: Diagnosis not present

## 2020-08-26 DIAGNOSIS — R600 Localized edema: Secondary | ICD-10-CM | POA: Diagnosis not present

## 2020-08-26 DIAGNOSIS — E1142 Type 2 diabetes mellitus with diabetic polyneuropathy: Secondary | ICD-10-CM | POA: Diagnosis not present

## 2020-08-26 DIAGNOSIS — N1831 Chronic kidney disease, stage 3a: Secondary | ICD-10-CM | POA: Diagnosis not present

## 2020-08-26 DIAGNOSIS — I129 Hypertensive chronic kidney disease with stage 1 through stage 4 chronic kidney disease, or unspecified chronic kidney disease: Secondary | ICD-10-CM | POA: Diagnosis not present

## 2020-09-24 DIAGNOSIS — I872 Venous insufficiency (chronic) (peripheral): Secondary | ICD-10-CM | POA: Diagnosis not present

## 2020-09-24 DIAGNOSIS — Z23 Encounter for immunization: Secondary | ICD-10-CM | POA: Diagnosis not present

## 2020-09-24 DIAGNOSIS — I129 Hypertensive chronic kidney disease with stage 1 through stage 4 chronic kidney disease, or unspecified chronic kidney disease: Secondary | ICD-10-CM | POA: Diagnosis not present

## 2020-09-24 DIAGNOSIS — E1142 Type 2 diabetes mellitus with diabetic polyneuropathy: Secondary | ICD-10-CM | POA: Diagnosis not present

## 2020-10-22 DIAGNOSIS — M8580 Other specified disorders of bone density and structure, unspecified site: Secondary | ICD-10-CM | POA: Diagnosis not present

## 2020-10-22 DIAGNOSIS — E039 Hypothyroidism, unspecified: Secondary | ICD-10-CM | POA: Diagnosis not present

## 2020-10-22 DIAGNOSIS — E1142 Type 2 diabetes mellitus with diabetic polyneuropathy: Secondary | ICD-10-CM | POA: Diagnosis not present

## 2020-10-22 DIAGNOSIS — Z7984 Long term (current) use of oral hypoglycemic drugs: Secondary | ICD-10-CM | POA: Diagnosis not present

## 2020-10-22 DIAGNOSIS — R600 Localized edema: Secondary | ICD-10-CM | POA: Diagnosis not present

## 2020-10-22 DIAGNOSIS — E1122 Type 2 diabetes mellitus with diabetic chronic kidney disease: Secondary | ICD-10-CM | POA: Diagnosis not present

## 2020-10-22 DIAGNOSIS — E7800 Pure hypercholesterolemia, unspecified: Secondary | ICD-10-CM | POA: Diagnosis not present

## 2020-10-22 DIAGNOSIS — I129 Hypertensive chronic kidney disease with stage 1 through stage 4 chronic kidney disease, or unspecified chronic kidney disease: Secondary | ICD-10-CM | POA: Diagnosis not present

## 2020-10-22 DIAGNOSIS — N1831 Chronic kidney disease, stage 3a: Secondary | ICD-10-CM | POA: Diagnosis not present

## 2020-10-22 DIAGNOSIS — G5601 Carpal tunnel syndrome, right upper limb: Secondary | ICD-10-CM | POA: Diagnosis not present

## 2020-10-23 DIAGNOSIS — H26491 Other secondary cataract, right eye: Secondary | ICD-10-CM | POA: Diagnosis not present

## 2020-11-24 DIAGNOSIS — N1831 Chronic kidney disease, stage 3a: Secondary | ICD-10-CM | POA: Diagnosis not present

## 2020-11-24 DIAGNOSIS — E1122 Type 2 diabetes mellitus with diabetic chronic kidney disease: Secondary | ICD-10-CM | POA: Diagnosis not present

## 2020-11-24 DIAGNOSIS — I129 Hypertensive chronic kidney disease with stage 1 through stage 4 chronic kidney disease, or unspecified chronic kidney disease: Secondary | ICD-10-CM | POA: Diagnosis not present

## 2020-11-24 DIAGNOSIS — R260 Ataxic gait: Secondary | ICD-10-CM | POA: Diagnosis not present

## 2021-01-14 DIAGNOSIS — F5101 Primary insomnia: Secondary | ICD-10-CM | POA: Diagnosis not present

## 2021-01-14 DIAGNOSIS — M79602 Pain in left arm: Secondary | ICD-10-CM | POA: Diagnosis not present

## 2021-01-14 DIAGNOSIS — R197 Diarrhea, unspecified: Secondary | ICD-10-CM | POA: Diagnosis not present

## 2021-01-14 DIAGNOSIS — N1831 Chronic kidney disease, stage 3a: Secondary | ICD-10-CM | POA: Diagnosis not present

## 2021-01-14 DIAGNOSIS — I129 Hypertensive chronic kidney disease with stage 1 through stage 4 chronic kidney disease, or unspecified chronic kidney disease: Secondary | ICD-10-CM | POA: Diagnosis not present

## 2021-02-04 DIAGNOSIS — E1122 Type 2 diabetes mellitus with diabetic chronic kidney disease: Secondary | ICD-10-CM | POA: Diagnosis not present

## 2021-02-04 DIAGNOSIS — N1831 Chronic kidney disease, stage 3a: Secondary | ICD-10-CM | POA: Diagnosis not present

## 2021-02-04 DIAGNOSIS — M8580 Other specified disorders of bone density and structure, unspecified site: Secondary | ICD-10-CM | POA: Diagnosis not present

## 2021-02-04 DIAGNOSIS — E7800 Pure hypercholesterolemia, unspecified: Secondary | ICD-10-CM | POA: Diagnosis not present

## 2021-02-04 DIAGNOSIS — I129 Hypertensive chronic kidney disease with stage 1 through stage 4 chronic kidney disease, or unspecified chronic kidney disease: Secondary | ICD-10-CM | POA: Diagnosis not present

## 2021-02-04 DIAGNOSIS — E1142 Type 2 diabetes mellitus with diabetic polyneuropathy: Secondary | ICD-10-CM | POA: Diagnosis not present

## 2021-02-04 DIAGNOSIS — E039 Hypothyroidism, unspecified: Secondary | ICD-10-CM | POA: Diagnosis not present

## 2021-02-13 DIAGNOSIS — D696 Thrombocytopenia, unspecified: Secondary | ICD-10-CM | POA: Diagnosis not present

## 2021-02-13 DIAGNOSIS — E7800 Pure hypercholesterolemia, unspecified: Secondary | ICD-10-CM | POA: Diagnosis not present

## 2021-02-13 DIAGNOSIS — I700 Atherosclerosis of aorta: Secondary | ICD-10-CM | POA: Diagnosis not present

## 2021-02-13 DIAGNOSIS — E1142 Type 2 diabetes mellitus with diabetic polyneuropathy: Secondary | ICD-10-CM | POA: Diagnosis not present

## 2021-02-13 DIAGNOSIS — I129 Hypertensive chronic kidney disease with stage 1 through stage 4 chronic kidney disease, or unspecified chronic kidney disease: Secondary | ICD-10-CM | POA: Diagnosis not present

## 2021-02-13 DIAGNOSIS — E1122 Type 2 diabetes mellitus with diabetic chronic kidney disease: Secondary | ICD-10-CM | POA: Diagnosis not present

## 2021-02-13 DIAGNOSIS — N1831 Chronic kidney disease, stage 3a: Secondary | ICD-10-CM | POA: Diagnosis not present

## 2021-05-01 DIAGNOSIS — G5601 Carpal tunnel syndrome, right upper limb: Secondary | ICD-10-CM | POA: Diagnosis not present

## 2021-05-01 DIAGNOSIS — Z1389 Encounter for screening for other disorder: Secondary | ICD-10-CM | POA: Diagnosis not present

## 2021-05-01 DIAGNOSIS — Z0000 Encounter for general adult medical examination without abnormal findings: Secondary | ICD-10-CM | POA: Diagnosis not present

## 2021-05-01 DIAGNOSIS — N1831 Chronic kidney disease, stage 3a: Secondary | ICD-10-CM | POA: Diagnosis not present

## 2021-05-01 DIAGNOSIS — D696 Thrombocytopenia, unspecified: Secondary | ICD-10-CM | POA: Diagnosis not present

## 2021-05-01 DIAGNOSIS — I129 Hypertensive chronic kidney disease with stage 1 through stage 4 chronic kidney disease, or unspecified chronic kidney disease: Secondary | ICD-10-CM | POA: Diagnosis not present

## 2021-05-01 DIAGNOSIS — G720 Drug-induced myopathy: Secondary | ICD-10-CM | POA: Diagnosis not present

## 2021-05-01 DIAGNOSIS — E039 Hypothyroidism, unspecified: Secondary | ICD-10-CM | POA: Diagnosis not present

## 2021-05-01 DIAGNOSIS — Z79899 Other long term (current) drug therapy: Secondary | ICD-10-CM | POA: Diagnosis not present

## 2021-05-01 DIAGNOSIS — E559 Vitamin D deficiency, unspecified: Secondary | ICD-10-CM | POA: Diagnosis not present

## 2021-05-01 DIAGNOSIS — E1142 Type 2 diabetes mellitus with diabetic polyneuropathy: Secondary | ICD-10-CM | POA: Diagnosis not present

## 2021-05-03 IMAGING — CR LUMBAR SPINE - 1 VIEW
1 series · 1 of 1 positions shown · non-contrast
Comparison: 08/07/2018

CLINICAL DATA: L4-5 micro discectomy.

EXAM:
LUMBAR SPINE - 1 VIEW

[lateral]
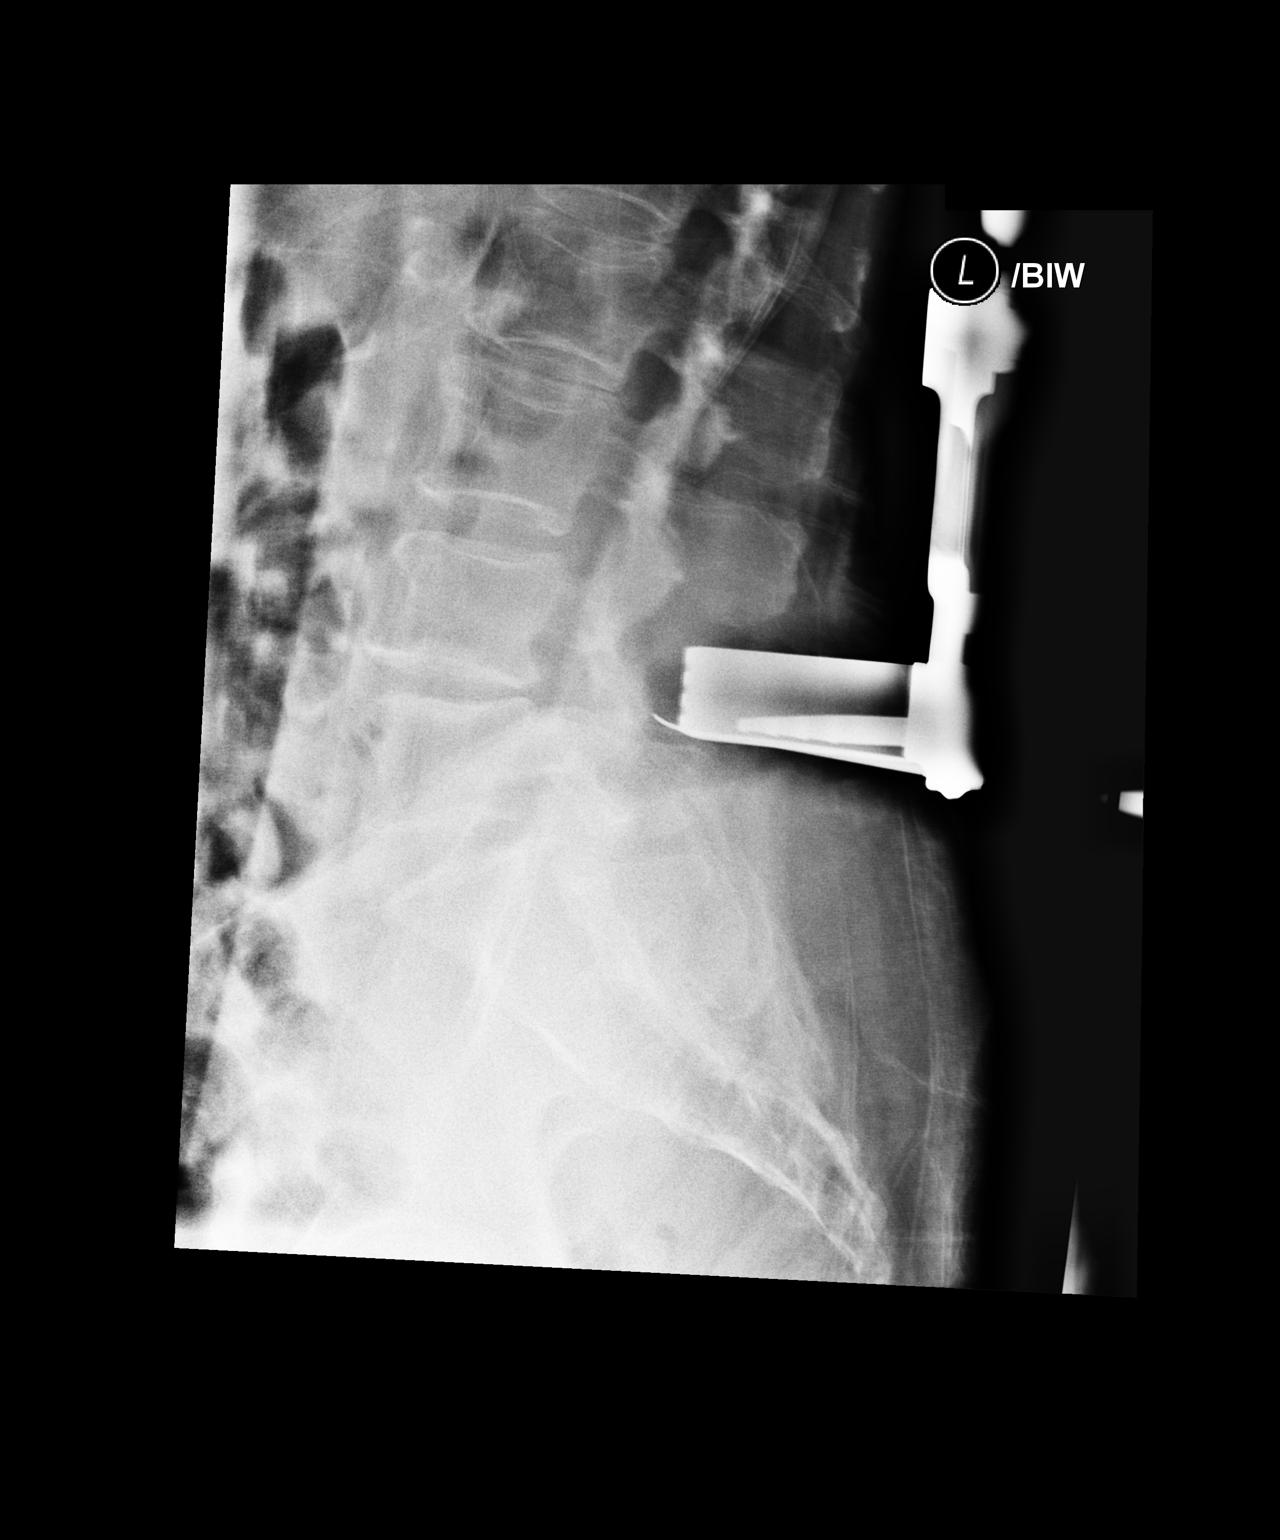

[1 of 1 positions shown; findings below may reference images not displayed]

FINDINGS: The alignment of the lumbar spine appears normal. There is a tissue
spreader posterior to the L4-5 disc space with surgical probe
directed towards the L4-5 disc space.
IMPRESSION: 1. Surgical probe localizes the L4-5 disc space.

## 2021-05-29 DIAGNOSIS — N1831 Chronic kidney disease, stage 3a: Secondary | ICD-10-CM | POA: Diagnosis not present

## 2021-05-29 DIAGNOSIS — I129 Hypertensive chronic kidney disease with stage 1 through stage 4 chronic kidney disease, or unspecified chronic kidney disease: Secondary | ICD-10-CM | POA: Diagnosis not present

## 2021-05-29 DIAGNOSIS — E1122 Type 2 diabetes mellitus with diabetic chronic kidney disease: Secondary | ICD-10-CM | POA: Diagnosis not present

## 2021-05-29 DIAGNOSIS — E7800 Pure hypercholesterolemia, unspecified: Secondary | ICD-10-CM | POA: Diagnosis not present

## 2021-05-29 DIAGNOSIS — E039 Hypothyroidism, unspecified: Secondary | ICD-10-CM | POA: Diagnosis not present

## 2021-08-24 DIAGNOSIS — H903 Sensorineural hearing loss, bilateral: Secondary | ICD-10-CM | POA: Diagnosis not present

## 2021-09-11 DIAGNOSIS — R202 Paresthesia of skin: Secondary | ICD-10-CM | POA: Diagnosis not present

## 2021-09-11 DIAGNOSIS — R200 Anesthesia of skin: Secondary | ICD-10-CM | POA: Diagnosis not present

## 2021-10-05 DIAGNOSIS — G5601 Carpal tunnel syndrome, right upper limb: Secondary | ICD-10-CM | POA: Diagnosis not present

## 2021-10-27 DIAGNOSIS — G5603 Carpal tunnel syndrome, bilateral upper limbs: Secondary | ICD-10-CM | POA: Diagnosis not present

## 2021-10-29 DIAGNOSIS — H26492 Other secondary cataract, left eye: Secondary | ICD-10-CM | POA: Diagnosis not present

## 2021-10-29 DIAGNOSIS — H52203 Unspecified astigmatism, bilateral: Secondary | ICD-10-CM | POA: Diagnosis not present

## 2021-10-29 DIAGNOSIS — Z961 Presence of intraocular lens: Secondary | ICD-10-CM | POA: Diagnosis not present

## 2021-11-17 DIAGNOSIS — G5603 Carpal tunnel syndrome, bilateral upper limbs: Secondary | ICD-10-CM | POA: Diagnosis not present

## 2021-12-22 DIAGNOSIS — G5603 Carpal tunnel syndrome, bilateral upper limbs: Secondary | ICD-10-CM | POA: Diagnosis not present

## 2021-12-22 DIAGNOSIS — M19041 Primary osteoarthritis, right hand: Secondary | ICD-10-CM | POA: Diagnosis not present

## 2022-04-01 DIAGNOSIS — I129 Hypertensive chronic kidney disease with stage 1 through stage 4 chronic kidney disease, or unspecified chronic kidney disease: Secondary | ICD-10-CM | POA: Diagnosis not present

## 2022-04-01 DIAGNOSIS — R590 Localized enlarged lymph nodes: Secondary | ICD-10-CM | POA: Diagnosis not present

## 2022-04-01 DIAGNOSIS — E1142 Type 2 diabetes mellitus with diabetic polyneuropathy: Secondary | ICD-10-CM | POA: Diagnosis not present

## 2022-05-27 DIAGNOSIS — E559 Vitamin D deficiency, unspecified: Secondary | ICD-10-CM | POA: Diagnosis not present

## 2022-05-27 DIAGNOSIS — I129 Hypertensive chronic kidney disease with stage 1 through stage 4 chronic kidney disease, or unspecified chronic kidney disease: Secondary | ICD-10-CM | POA: Diagnosis not present

## 2022-05-27 DIAGNOSIS — E1122 Type 2 diabetes mellitus with diabetic chronic kidney disease: Secondary | ICD-10-CM | POA: Diagnosis not present

## 2022-05-27 DIAGNOSIS — R202 Paresthesia of skin: Secondary | ICD-10-CM | POA: Diagnosis not present

## 2022-05-27 DIAGNOSIS — D696 Thrombocytopenia, unspecified: Secondary | ICD-10-CM | POA: Diagnosis not present

## 2022-05-27 DIAGNOSIS — Z9181 History of falling: Secondary | ICD-10-CM | POA: Diagnosis not present

## 2022-05-27 DIAGNOSIS — N1831 Chronic kidney disease, stage 3a: Secondary | ICD-10-CM | POA: Diagnosis not present

## 2022-05-27 DIAGNOSIS — F5101 Primary insomnia: Secondary | ICD-10-CM | POA: Diagnosis not present

## 2022-05-27 DIAGNOSIS — Z0000 Encounter for general adult medical examination without abnormal findings: Secondary | ICD-10-CM | POA: Diagnosis not present

## 2022-05-27 DIAGNOSIS — E7800 Pure hypercholesterolemia, unspecified: Secondary | ICD-10-CM | POA: Diagnosis not present

## 2022-05-27 DIAGNOSIS — E039 Hypothyroidism, unspecified: Secondary | ICD-10-CM | POA: Diagnosis not present

## 2022-05-27 DIAGNOSIS — R54 Age-related physical debility: Secondary | ICD-10-CM | POA: Diagnosis not present

## 2022-05-27 DIAGNOSIS — I700 Atherosclerosis of aorta: Secondary | ICD-10-CM | POA: Diagnosis not present

## 2022-07-13 DIAGNOSIS — Z79899 Other long term (current) drug therapy: Secondary | ICD-10-CM | POA: Diagnosis not present

## 2022-08-11 DIAGNOSIS — M542 Cervicalgia: Secondary | ICD-10-CM | POA: Diagnosis not present

## 2022-08-11 DIAGNOSIS — M79644 Pain in right finger(s): Secondary | ICD-10-CM | POA: Diagnosis not present

## 2022-08-11 DIAGNOSIS — M545 Low back pain, unspecified: Secondary | ICD-10-CM | POA: Diagnosis not present

## 2022-08-11 DIAGNOSIS — M25561 Pain in right knee: Secondary | ICD-10-CM | POA: Diagnosis not present

## 2022-08-11 DIAGNOSIS — M25562 Pain in left knee: Secondary | ICD-10-CM | POA: Diagnosis not present

## 2022-08-25 DIAGNOSIS — R1084 Generalized abdominal pain: Secondary | ICD-10-CM | POA: Diagnosis not present

## 2022-08-25 DIAGNOSIS — E039 Hypothyroidism, unspecified: Secondary | ICD-10-CM | POA: Diagnosis not present

## 2022-10-07 DIAGNOSIS — I129 Hypertensive chronic kidney disease with stage 1 through stage 4 chronic kidney disease, or unspecified chronic kidney disease: Secondary | ICD-10-CM | POA: Diagnosis not present

## 2022-10-07 DIAGNOSIS — E1142 Type 2 diabetes mellitus with diabetic polyneuropathy: Secondary | ICD-10-CM | POA: Diagnosis not present

## 2022-10-07 DIAGNOSIS — G5601 Carpal tunnel syndrome, right upper limb: Secondary | ICD-10-CM | POA: Diagnosis not present

## 2022-10-07 DIAGNOSIS — M170 Bilateral primary osteoarthritis of knee: Secondary | ICD-10-CM | POA: Diagnosis not present

## 2022-10-07 DIAGNOSIS — N1831 Chronic kidney disease, stage 3a: Secondary | ICD-10-CM | POA: Diagnosis not present

## 2022-10-07 DIAGNOSIS — E1122 Type 2 diabetes mellitus with diabetic chronic kidney disease: Secondary | ICD-10-CM | POA: Diagnosis not present

## 2022-10-07 DIAGNOSIS — E039 Hypothyroidism, unspecified: Secondary | ICD-10-CM | POA: Diagnosis not present

## 2022-10-07 DIAGNOSIS — Z23 Encounter for immunization: Secondary | ICD-10-CM | POA: Diagnosis not present

## 2022-11-09 DIAGNOSIS — G5601 Carpal tunnel syndrome, right upper limb: Secondary | ICD-10-CM | POA: Diagnosis not present

## 2022-12-01 DIAGNOSIS — H524 Presbyopia: Secondary | ICD-10-CM | POA: Diagnosis not present

## 2022-12-01 DIAGNOSIS — Z961 Presence of intraocular lens: Secondary | ICD-10-CM | POA: Diagnosis not present

## 2022-12-07 DIAGNOSIS — G5601 Carpal tunnel syndrome, right upper limb: Secondary | ICD-10-CM | POA: Diagnosis not present

## 2022-12-16 IMAGING — CT CT L SPINE W/O CM
3 of 4 series · 12 of 33 positions shown, 14 images · non-contrast
Comparison: Radiography 08/18/2018.  MRI 02/02/2018.

CLINICAL DATA: Back pain and leg pain

EXAM:
CT LUMBAR SPINE WITHOUT CONTRAST
TECHNIQUE: Multidetector CT imaging of the lumbar spine was performed without
intravenous contrast administration. Multiplanar CT image
reconstructions were also generated.

[Series 5: l spine 2.0 st · axial · 0.30mm/px · z∈[+934,+1094]mm · 4 of 106 slices shown, 5 images]
[im 16/106  soft-tissue]
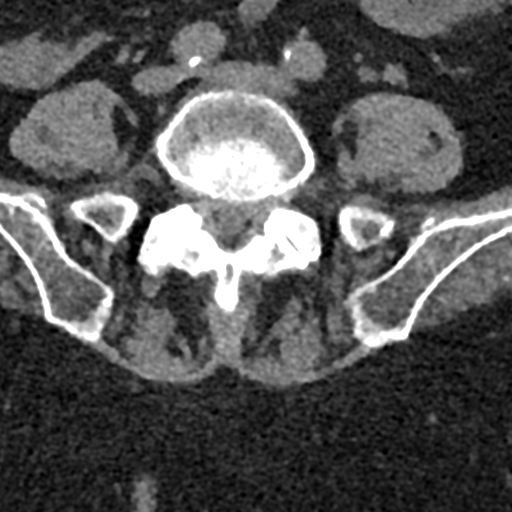
[im 16/106  bone]
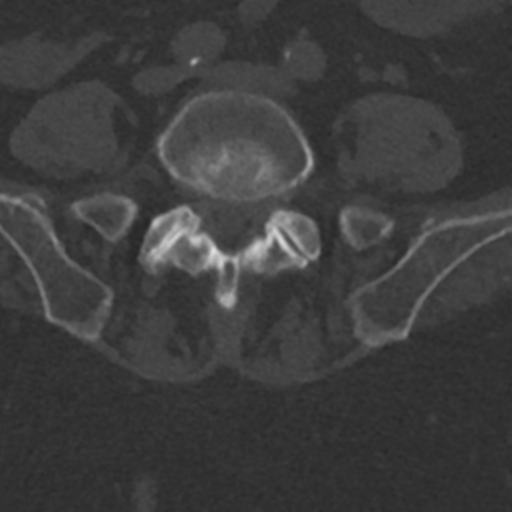
[im 46/106  bone]
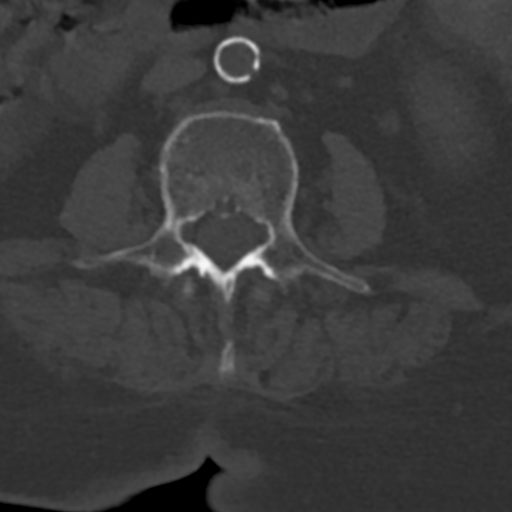
[im 61/106  bone]
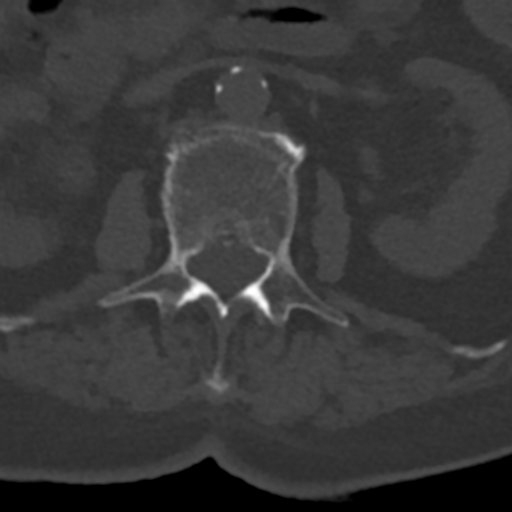
[im 91/106  bone]
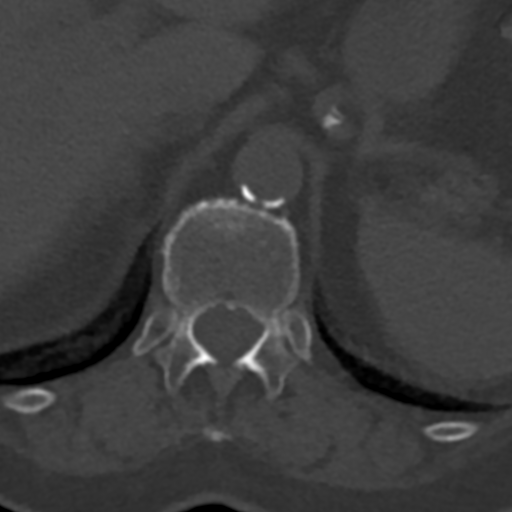

[Series 8: coronal st · coronal · 0.35mm/px · 3 of 66 slices shown]
[im 14/66  bone]
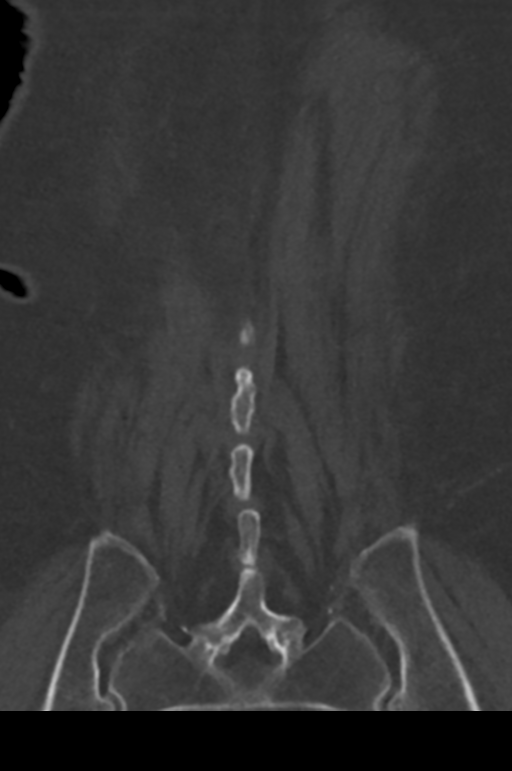
[im 27/66  bone]
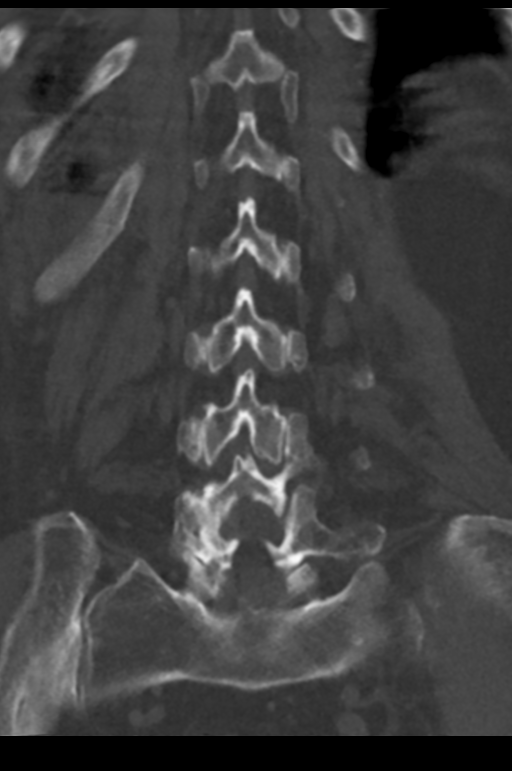
[im 40/66  bone]
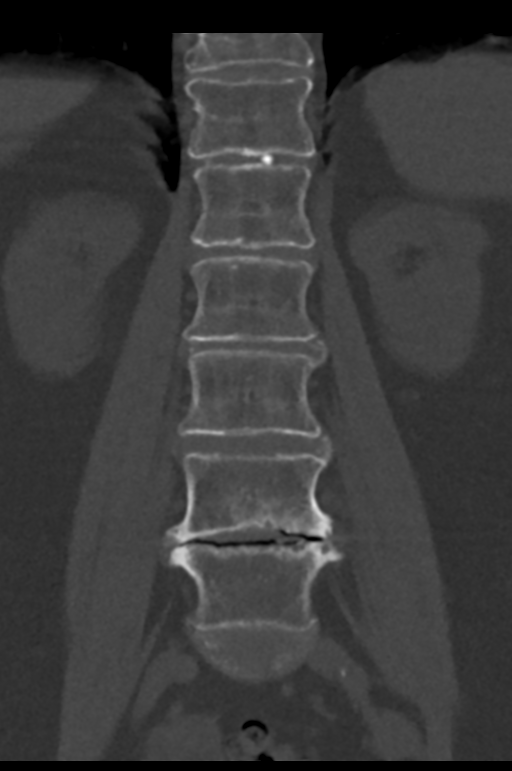

[Series 9: sagittal st · sagittal · 0.35mm/px · 5 of 61 slices shown, 6 images]
[im 21/61  bone]
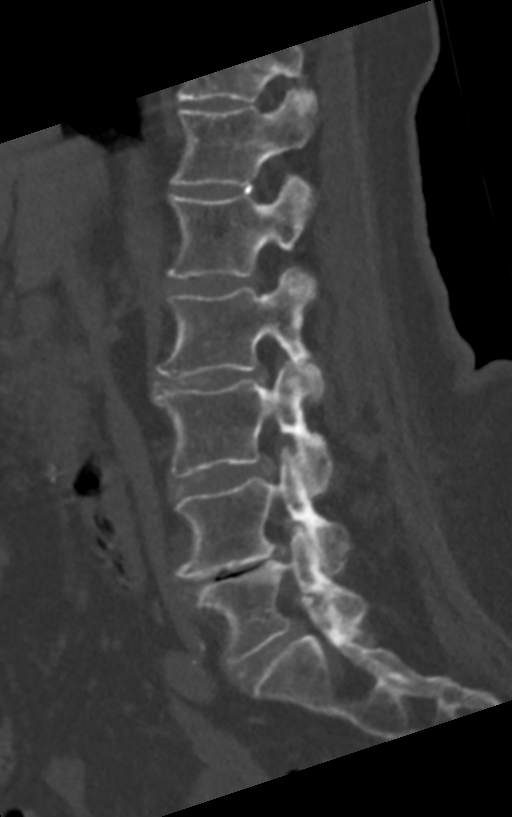
[im 26/61  bone]
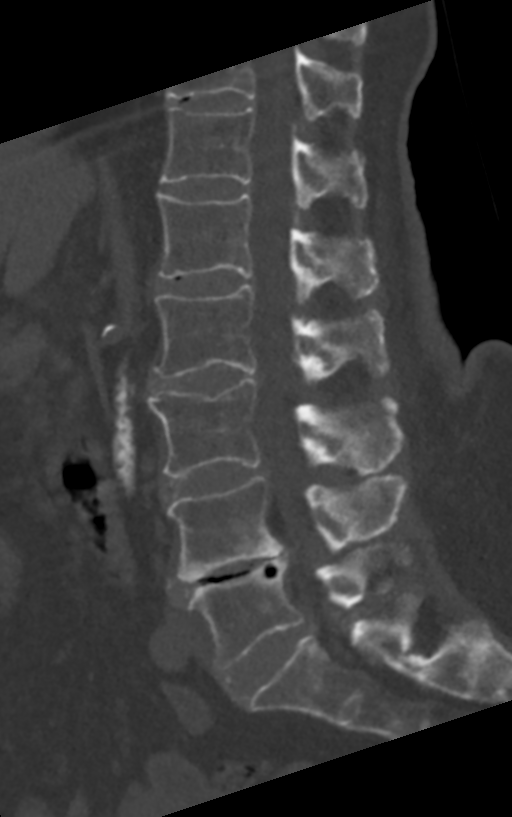
[im 31/61  soft-tissue]
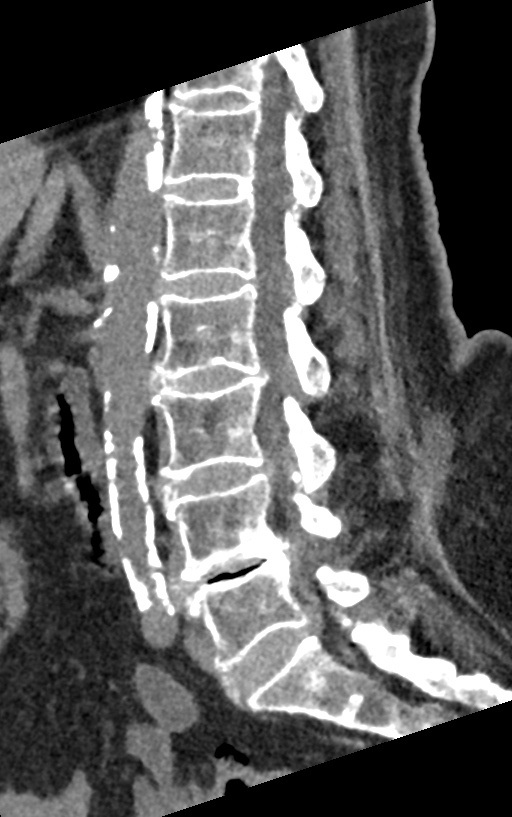
[im 31/61  bone]
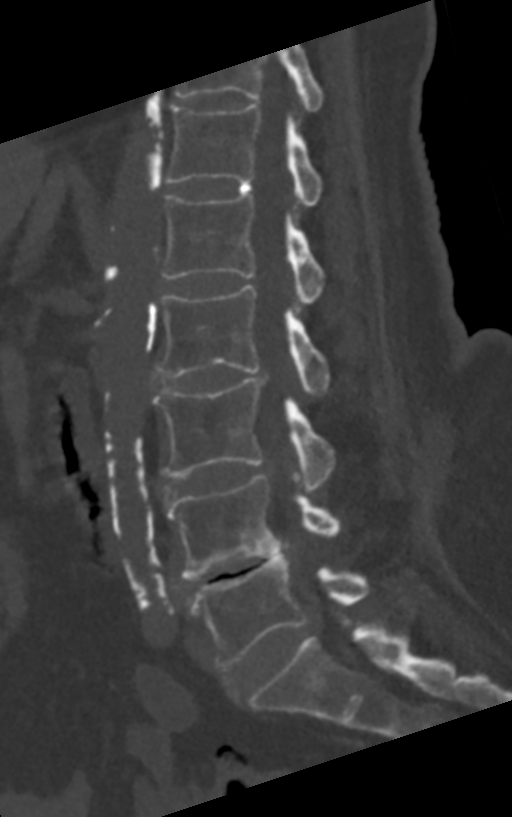
[im 36/61  bone]
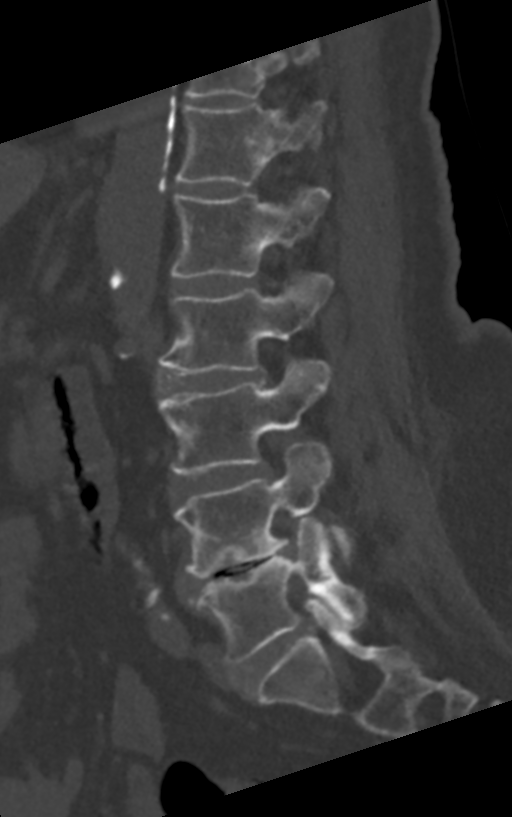
[im 41/61  bone]
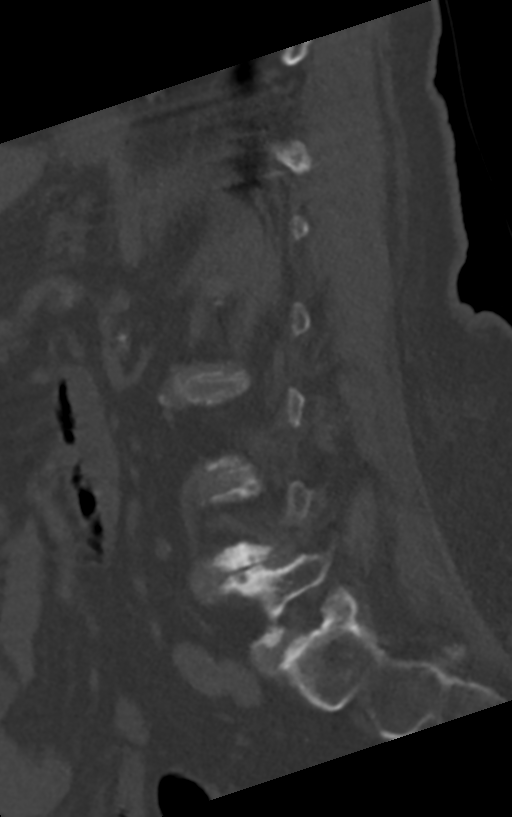

[12 of 33 positions shown; findings below may reference images not displayed]

FINDINGS: Segmentation: 5 lumbar type vertebral bodies.

Alignment: Normal

Vertebrae: No fracture or primary bone lesion. Chronic discogenic
endplate changes at L4-5, worsened since previous studies.

Paraspinal and other soft tissues: Aortic atherosclerosis. No other
notable finding.

Disc levels: No significant finding from T11-12 through L3-4. Mild
bulging of the discs. No compressive canal or foraminal stenosis.

L4-5: Advanced disc degeneration with loss of disc height and vacuum
phenomenon. Chronic endplate changes. Endplate osteophytes and
bulging of the disc. Previous partial hemilaminectomy on the left.
Some potential for spinal stenosis at this level, even considering
the previous laminectomy. Neural foramina appear sufficiently
patent. Findings at this level could certainly relate to back pain.

L5-S1: Bulging of the disc. Facet and ligamentous hypertrophy.
Stenosis of the subarticular lateral recesses that could possibly
affect the S1 nerves. The facet arthritis at this level could
contribute to low back pain.
IMPRESSION: 1. L4-5: Advanced and progressive disc degeneration with loss of
disc height and vacuum phenomenon. Chronic endplate changes.
Previous partial hemilaminectomy on the left. Endplate osteophytes
and bulging of the disc. Some potential for spinal stenosis at this
level, even considering the previous laminectomy. Neural foramina
appear sufficiently patent. Findings at this level could certainly
relate to back pain.
2. L5-S1: Bulging of the disc. Facet and ligamentous hypertrophy.
Stenosis of the subarticular lateral recesses that could possibly
affect the S1 nerves. The facet arthritis at this level could
contribute to low back pain.

Aortic Atherosclerosis ([id number]-26P.P).

## 2022-12-29 DIAGNOSIS — G5601 Carpal tunnel syndrome, right upper limb: Secondary | ICD-10-CM | POA: Diagnosis not present

## 2023-01-28 DIAGNOSIS — G5601 Carpal tunnel syndrome, right upper limb: Secondary | ICD-10-CM | POA: Diagnosis not present

## 2023-01-28 DIAGNOSIS — N1831 Chronic kidney disease, stage 3a: Secondary | ICD-10-CM | POA: Diagnosis not present

## 2023-01-28 DIAGNOSIS — E1142 Type 2 diabetes mellitus with diabetic polyneuropathy: Secondary | ICD-10-CM | POA: Diagnosis not present

## 2023-01-28 DIAGNOSIS — I129 Hypertensive chronic kidney disease with stage 1 through stage 4 chronic kidney disease, or unspecified chronic kidney disease: Secondary | ICD-10-CM | POA: Diagnosis not present

## 2023-01-28 DIAGNOSIS — J40 Bronchitis, not specified as acute or chronic: Secondary | ICD-10-CM | POA: Diagnosis not present

## 2023-01-28 DIAGNOSIS — E1122 Type 2 diabetes mellitus with diabetic chronic kidney disease: Secondary | ICD-10-CM | POA: Diagnosis not present

## 2023-03-11 DIAGNOSIS — G5601 Carpal tunnel syndrome, right upper limb: Secondary | ICD-10-CM | POA: Diagnosis not present

## 2023-05-30 DIAGNOSIS — F5101 Primary insomnia: Secondary | ICD-10-CM | POA: Diagnosis not present

## 2023-05-30 DIAGNOSIS — Z0000 Encounter for general adult medical examination without abnormal findings: Secondary | ICD-10-CM | POA: Diagnosis not present

## 2023-05-30 DIAGNOSIS — E1142 Type 2 diabetes mellitus with diabetic polyneuropathy: Secondary | ICD-10-CM | POA: Diagnosis not present

## 2023-05-30 DIAGNOSIS — E559 Vitamin D deficiency, unspecified: Secondary | ICD-10-CM | POA: Diagnosis not present

## 2023-05-30 DIAGNOSIS — E1122 Type 2 diabetes mellitus with diabetic chronic kidney disease: Secondary | ICD-10-CM | POA: Diagnosis not present

## 2023-05-30 DIAGNOSIS — N1831 Chronic kidney disease, stage 3a: Secondary | ICD-10-CM | POA: Diagnosis not present

## 2023-05-30 DIAGNOSIS — Z23 Encounter for immunization: Secondary | ICD-10-CM | POA: Diagnosis not present

## 2023-05-30 DIAGNOSIS — R202 Paresthesia of skin: Secondary | ICD-10-CM | POA: Diagnosis not present

## 2023-05-30 DIAGNOSIS — E7800 Pure hypercholesterolemia, unspecified: Secondary | ICD-10-CM | POA: Diagnosis not present

## 2023-05-30 DIAGNOSIS — I129 Hypertensive chronic kidney disease with stage 1 through stage 4 chronic kidney disease, or unspecified chronic kidney disease: Secondary | ICD-10-CM | POA: Diagnosis not present

## 2023-05-30 DIAGNOSIS — E039 Hypothyroidism, unspecified: Secondary | ICD-10-CM | POA: Diagnosis not present

## 2023-05-30 DIAGNOSIS — D696 Thrombocytopenia, unspecified: Secondary | ICD-10-CM | POA: Diagnosis not present

## 2023-06-21 DIAGNOSIS — M65332 Trigger finger, left middle finger: Secondary | ICD-10-CM | POA: Diagnosis not present

## 2023-06-21 DIAGNOSIS — G5601 Carpal tunnel syndrome, right upper limb: Secondary | ICD-10-CM | POA: Diagnosis not present

## 2023-07-26 DIAGNOSIS — M65332 Trigger finger, left middle finger: Secondary | ICD-10-CM | POA: Diagnosis not present

## 2023-07-26 DIAGNOSIS — M1812 Unilateral primary osteoarthritis of first carpometacarpal joint, left hand: Secondary | ICD-10-CM | POA: Diagnosis not present

## 2023-08-03 DIAGNOSIS — G5602 Carpal tunnel syndrome, left upper limb: Secondary | ICD-10-CM | POA: Diagnosis not present

## 2023-08-10 ENCOUNTER — Ambulatory Visit (INDEPENDENT_AMBULATORY_CARE_PROVIDER_SITE_OTHER): Admitting: Otolaryngology

## 2023-08-10 VITALS — BP 198/74 | HR 70

## 2023-08-10 DIAGNOSIS — H6121 Impacted cerumen, right ear: Secondary | ICD-10-CM | POA: Diagnosis not present

## 2023-08-10 DIAGNOSIS — T161XXA Foreign body in right ear, initial encounter: Secondary | ICD-10-CM

## 2023-08-10 DIAGNOSIS — H903 Sensorineural hearing loss, bilateral: Secondary | ICD-10-CM | POA: Diagnosis not present

## 2023-08-10 MED ORDER — MOMETASONE FUROATE 0.1 % EX CREA: TOPICAL_CREAM | CUTANEOUS | 3 refills | Status: AC

## 2023-08-10 NOTE — Progress Notes (Unsigned)
 CC: Hearing loss, possible right ear foreign body  HPI:  Kelly Barker is a 89 y.o. female who presents today complaining of possible foreign body in her right ear canal.  According to the patient, she has a history of bilateral progressive sensorineural hearing loss.  She has been wearing bilateral hearing aids for the past 2 years.  Recently she lost a piece of her right ear hearing aid.  She suspects it is stuck in her right ear canal.  She has a clogging sensation in the right ear.  Currently she denies any significant otalgia, otorrhea, or vertigo.  Past Medical History:  Diagnosis Date   Alopecia (capitis) totalis    Chronic back pain    Chronic headaches    migraines until menopause none since   Chronic renal disease, stage 3, moderately decreased glomerular filtration rate between 30-59 mL/min/1.73 square meter (HCC)    Degenerative joint disease    Depression    Diabetes mellitus due to underlying condition with diabetic chronic kidney disease (HCC)    type 2   Diverticulitis 08/2016   Diverticulosis    Dyspnea    with exertion   Hematuria, microscopic    Hyperlipidemia    Hypertension    Hypothyroidism    Osteopenia    Thrombocytopenia (HCC)    Vitamin D deficiency     Past Surgical History:  Procedure Laterality Date   bladder stretching surgery  yrs ago   x 2   CATARACT EXTRACTION, BILATERAL  6/18, 8/18   CYSTOSCOPY WITH URETHRAL DILATATION N/A 11/29/2017   Procedure: CYSTOSCOPY WITH URETHRAL DILATATION;  Surgeon: Sherrilee Belvie CROME, MD;  Location: [facility];  Service: Urology;  Laterality: N/A;  30 MINS   LUMBAR LAMINECTOMY/DECOMPRESSION MICRODISCECTOMY Left 08/18/2018   Procedure: Microdiscectomy - left - Lumbar four-lumbar five;  Surgeon: Louis Shove, MD;  Location: [facility] OR;  Service: Neurosurgery;  Laterality: Left;    Family History  Problem Relation Age of Onset   Uterine cancer Mother    Bladder Cancer Father    Dementia Sister      Social History:  reports that she has never smoked. She has never used smokeless tobacco. She reports that she does not drink alcohol and does not use drugs.  Allergies:  Allergies  Allergen Reactions   Crestor [Rosuvastatin] Other (See Comments)    Muscle cramps   Lisinopril Other (See Comments)    Decreased GFR.   Pravachol [Pravastatin Sodium] Other (See Comments)    Leg cramps.   Simvastatin Other (See Comments)    Leg cramps   Tolectin [Tolmetin] Swelling    Prior to Admission medications   Medication Sig Start Date End Date Taking? Authorizing Provider  acetaminophen  (TYLENOL ) 325 MG tablet Take 325 mg by mouth every 6 (six) hours as needed for mild pain.   Yes [provider]  amLODipine  (NORVASC ) 5 MG tablet Take 5 mg by mouth daily.   Yes [provider]  atenolol  (TENORMIN ) 50 MG tablet Take 50 mg by mouth daily.   Yes [provider]  cyclobenzaprine  (FLEXERIL ) 5 MG tablet Take 1 tablet (5 mg total) by mouth 3 (three) times daily as needed for muscle spasms. 08/19/18  Yes Cheryle Debby LABOR, MD  gabapentin  (NEURONTIN ) 100 MG capsule Take 300 mg by mouth 2 (two) times a day. 07/06/18  Yes [provider]  glimepiride  (AMARYL ) 2 MG tablet Take 2 mg by mouth daily. With main meal daily 08/07/18  Yes [provider]  HYDROcodone -acetaminophen  (NORCO/VICODIN) 5-325 MG tablet Take 1 tablet by mouth every 6 (six) hours as needed. 04/01/20  Yes Joldersma, Logan, PA-C  levothyroxine  (SYNTHROID , LEVOTHROID) 137 MCG tablet Take 137 mcg by mouth daily before breakfast.    Yes [provider]  LORazepam  (ATIVAN ) 1 MG tablet Take 1 mg by mouth 2 (two) times daily. Take one tablet twice daily.   Yes [provider]  mometasone  (ELOCON ) 0.1 % cream Apply topically daily as needed for itch 08/10/23  Yes Karis Clunes, MD  sertraline  (ZOLOFT ) 50 MG tablet Take 50 mg by mouth daily.   Yes [provider]  traZODone  (DESYREL ) 50  MG tablet Take 50 mg by mouth at bedtime.   Yes [provider]  triamterene -hydrochlorothiazide  (DYAZIDE ) 37.5-25 MG per capsule Take 1 capsule by mouth every morning.   Yes [provider]  diclofenac  sodium (VOLTAREN ) 1 % GEL Apply 4 g topically 4 (four) times daily. Patient not taking: Reported on 08/10/2023 08/07/18   Emil Share, DO    Blood pressure (!) 198/74, pulse 70, SpO2 95%. Exam: General: Communicates without difficulty, well nourished, no acute distress. Head: Normocephalic, no evidence injury, no tenderness, facial buttresses intact without stepoff. Face/sinus: No tenderness to palpation and percussion. Facial movement is normal and symmetric. Eyes: PERRL, EOMI. No scleral icterus, conjunctivae clear. Neuro: CN II exam reveals vision grossly intact.  No nystagmus at any point of gaze. Ears: Auricles well formed without lesions.  Cerumen is not noted to impact the medial aspect of the right ear canal.  No foreign body is noted.  The left ear canal and tympanic membrane are normal.  Nose: External evaluation reveals normal support and skin without lesions.  Dorsum is intact.  Anterior rhinoscopy reveals congested mucosa over anterior aspect of inferior turbinates and intact septum.  No purulence noted. Oral:  Oral cavity and oropharynx are intact, symmetric, without erythema or edema.  Mucosa is moist without lesions. Neck: Full range of motion without pain.  There is no significant lymphadenopathy.  No masses palpable.  Thyroid  bed within normal limits to palpation.  Parotid glands and submandibular glands equal bilaterally without mass.  Trachea is midline. Neuro:  CN 2-12 grossly intact.   Procedure: Right ear cerumen disimpaction Anesthesia: None Description: Under the operating microscope, the cerumen is carefully removed with a combination of cerumen currette, alligator forceps, and suction catheters.  After the cerumen is removed, the TMs are noted to be normal.  No  mass, erythema, or lesions. The patient tolerated the procedure well.    Assessment: 1.  A small amount of cerumen is noted to be impacted on the medial aspect of the right ear canal.  After the cerumen removal procedure, the tympanic membrane and middle ear space are noted to be normal.  Her previously noted foreign body has dislodged. 2.  Bilateral sensorineural hearing loss.  The patient is wearing bilateral hearing aids.  Plan: 1.  Otomicroscopy with right ear cerumen disimpaction. 2.  The physical exam findings are reviewed with the patient.  The patient is reassured that her right ear canal foreign body has dislodged. 3.  Continue the use of her hearing aids. 4.  The patient is encouraged to call with any questions or concerns.  Loye Reininger W Jatavious Peppard 08/10/2023, 2:44 PM

## 2023-08-11 DIAGNOSIS — H6121 Impacted cerumen, right ear: Secondary | ICD-10-CM | POA: Insufficient documentation

## 2023-08-11 DIAGNOSIS — H903 Sensorineural hearing loss, bilateral: Secondary | ICD-10-CM | POA: Insufficient documentation

## 2023-08-11 DIAGNOSIS — T161XXA Foreign body in right ear, initial encounter: Secondary | ICD-10-CM | POA: Insufficient documentation

## 2023-09-26 DIAGNOSIS — Z961 Presence of intraocular lens: Secondary | ICD-10-CM | POA: Diagnosis not present

## 2023-09-26 DIAGNOSIS — H52203 Unspecified astigmatism, bilateral: Secondary | ICD-10-CM | POA: Diagnosis not present
